# Patient Record
Sex: Female | Born: 1970
Health system: Southern US, Community
[De-identification: ages and names within clinical notes are randomized; demographics above are authoritative.]

## PROBLEM LIST (undated history)

## (undated) DIAGNOSIS — IMO0001 Reserved for inherently not codable concepts without codable children: Secondary | ICD-10-CM

## (undated) DIAGNOSIS — T8859XA Other complications of anesthesia, initial encounter: Secondary | ICD-10-CM

## (undated) DIAGNOSIS — Z5189 Encounter for other specified aftercare: Secondary | ICD-10-CM

## (undated) DIAGNOSIS — J189 Pneumonia, unspecified organism: Secondary | ICD-10-CM

## (undated) DIAGNOSIS — E785 Hyperlipidemia, unspecified: Secondary | ICD-10-CM

## (undated) DIAGNOSIS — T4145XA Adverse effect of unspecified anesthetic, initial encounter: Secondary | ICD-10-CM

## (undated) DIAGNOSIS — R112 Nausea with vomiting, unspecified: Secondary | ICD-10-CM

## (undated) DIAGNOSIS — Z9889 Other specified postprocedural states: Secondary | ICD-10-CM

## (undated) DIAGNOSIS — G4730 Sleep apnea, unspecified: Secondary | ICD-10-CM

## (undated) DIAGNOSIS — Z8489 Family history of other specified conditions: Secondary | ICD-10-CM

## (undated) DIAGNOSIS — A491 Streptococcal infection, unspecified site: Secondary | ICD-10-CM

## (undated) DIAGNOSIS — K5792 Diverticulitis of intestine, part unspecified, without perforation or abscess without bleeding: Secondary | ICD-10-CM

## (undated) DIAGNOSIS — G43909 Migraine, unspecified, not intractable, without status migrainosus: Secondary | ICD-10-CM

## (undated) DIAGNOSIS — I10 Essential (primary) hypertension: Secondary | ICD-10-CM

## (undated) HISTORY — PX: COLONOSCOPY: SHX174

## (undated) HISTORY — PX: OTHER SURGICAL HISTORY: SHX169

## (undated) HISTORY — PX: TUBAL LIGATION: SHX77

---
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
## 1999-09-23 ENCOUNTER — Other Ambulatory Visit: Admission: RE | Admit: 1999-09-23 | Discharge: 1999-09-23 | Payer: Self-pay | Admitting: *Deleted

## 2000-03-15 ENCOUNTER — Inpatient Hospital Stay (HOSPITAL_COMMUNITY): Admission: AD | Admit: 2000-03-15 | Discharge: 2000-03-15 | Payer: Self-pay | Admitting: Obstetrics and Gynecology

## 2000-04-09 ENCOUNTER — Inpatient Hospital Stay (HOSPITAL_COMMUNITY): Admission: AD | Admit: 2000-04-09 | Discharge: 2000-04-12 | Payer: Self-pay | Admitting: Obstetrics and Gynecology

## 2000-04-09 ENCOUNTER — Encounter (INDEPENDENT_AMBULATORY_CARE_PROVIDER_SITE_OTHER): Payer: Self-pay

## 2000-08-15 ENCOUNTER — Inpatient Hospital Stay (HOSPITAL_COMMUNITY): Admission: AD | Admit: 2000-08-15 | Discharge: 2000-08-15 | Payer: Self-pay | Admitting: Obstetrics and Gynecology

## 2000-08-21 ENCOUNTER — Observation Stay (HOSPITAL_COMMUNITY): Admission: EM | Admit: 2000-08-21 | Discharge: 2000-08-22 | Payer: Self-pay | Admitting: Emergency Medicine

## 2000-12-30 ENCOUNTER — Emergency Department (HOSPITAL_COMMUNITY): Admission: EM | Admit: 2000-12-30 | Discharge: 2000-12-31 | Payer: Self-pay | Admitting: *Deleted

## 2001-04-14 ENCOUNTER — Encounter: Admission: RE | Admit: 2001-04-14 | Discharge: 2001-04-14 | Payer: Self-pay | Admitting: Gastroenterology

## 2001-04-14 ENCOUNTER — Encounter: Payer: Self-pay | Admitting: Gastroenterology

## 2001-08-24 ENCOUNTER — Encounter: Payer: Self-pay | Admitting: Emergency Medicine

## 2001-08-24 ENCOUNTER — Emergency Department (HOSPITAL_COMMUNITY): Admission: EM | Admit: 2001-08-24 | Discharge: 2001-08-24 | Payer: Self-pay | Admitting: Emergency Medicine

## 2002-04-01 ENCOUNTER — Encounter: Payer: Self-pay | Admitting: Emergency Medicine

## 2002-04-01 ENCOUNTER — Emergency Department (HOSPITAL_COMMUNITY): Admission: EM | Admit: 2002-04-01 | Discharge: 2002-04-01 | Payer: Self-pay | Admitting: Emergency Medicine

## 2002-04-03 ENCOUNTER — Ambulatory Visit (HOSPITAL_COMMUNITY): Admission: RE | Admit: 2002-04-03 | Discharge: 2002-04-03 | Payer: Self-pay | Admitting: Family Medicine

## 2002-04-03 ENCOUNTER — Encounter: Payer: Self-pay | Admitting: Family Medicine

## 2003-03-12 ENCOUNTER — Encounter: Payer: Self-pay | Admitting: Family Medicine

## 2003-03-12 ENCOUNTER — Ambulatory Visit (HOSPITAL_COMMUNITY): Admission: RE | Admit: 2003-03-12 | Discharge: 2003-03-12 | Payer: Self-pay | Admitting: Family Medicine

## 2004-03-09 ENCOUNTER — Emergency Department (HOSPITAL_COMMUNITY): Admission: EM | Admit: 2004-03-09 | Discharge: 2004-03-09 | Payer: Self-pay | Admitting: Emergency Medicine

## 2004-03-11 ENCOUNTER — Emergency Department (HOSPITAL_COMMUNITY): Admission: EM | Admit: 2004-03-11 | Discharge: 2004-03-11 | Payer: Self-pay | Admitting: Emergency Medicine

## 2004-10-19 ENCOUNTER — Emergency Department (HOSPITAL_COMMUNITY): Admission: EM | Admit: 2004-10-19 | Discharge: 2004-10-19 | Payer: Self-pay | Admitting: Emergency Medicine

## 2005-04-13 ENCOUNTER — Emergency Department (HOSPITAL_COMMUNITY): Admission: EM | Admit: 2005-04-13 | Discharge: 2005-04-13 | Payer: Self-pay | Admitting: Emergency Medicine

## 2005-06-15 ENCOUNTER — Observation Stay (HOSPITAL_COMMUNITY): Admission: EM | Admit: 2005-06-15 | Discharge: 2005-06-17 | Payer: Self-pay | Admitting: Emergency Medicine

## 2007-01-03 ENCOUNTER — Emergency Department (HOSPITAL_COMMUNITY): Admission: EM | Admit: 2007-01-03 | Discharge: 2007-01-04 | Payer: Self-pay | Admitting: Emergency Medicine

## 2007-01-13 ENCOUNTER — Ambulatory Visit: Payer: Self-pay | Admitting: Cardiology

## 2007-02-06 ENCOUNTER — Ambulatory Visit: Payer: Self-pay | Admitting: Cardiology

## 2007-02-06 LAB — CONVERTED CEMR LAB
BUN: 12 mg/dL (ref 6–23)
Bacteria, UA: NEGATIVE
Bilirubin Urine: NEGATIVE
CO2: 20 meq/L (ref 19–32)
Calcium: 8.7 mg/dL (ref 8.4–10.5)
Chloride: 107 meq/L (ref 96–112)
Creatinine, Ser: 0.9 mg/dL (ref 0.4–1.2)
Crystals: NEGATIVE
GFR calc Af Amer: 92 mL/min
GFR calc non Af Amer: 76 mL/min
Glucose, Bld: 81 mg/dL (ref 70–99)
Hemoglobin, Urine: NEGATIVE
Ketones, ur: NEGATIVE mg/dL
Leukocytes, UA: NEGATIVE
Mucus, UA: NEGATIVE
Nitrite: NEGATIVE
Potassium: 3.3 meq/L — ABNORMAL LOW (ref 3.5–5.1)
Sodium: 139 meq/L (ref 135–145)
Specific Gravity, Urine: >=1.03 (ref 1.000–1.03)
Total Protein, Urine: 100 mg/dL — AB
Urine Glucose: NEGATIVE mg/dL
Urobilinogen, UA: 0.2 (ref 0.0–1.0)
pH: 5.5 (ref 5.0–8.0)

## 2007-07-12 ENCOUNTER — Observation Stay (HOSPITAL_COMMUNITY): Admission: EM | Admit: 2007-07-12 | Discharge: 2007-07-14 | Payer: Self-pay | Admitting: Emergency Medicine

## 2007-07-13 ENCOUNTER — Encounter: Payer: Self-pay | Admitting: Internal Medicine

## 2007-07-13 ENCOUNTER — Ambulatory Visit: Payer: Self-pay | Admitting: Cardiology

## 2007-07-13 ENCOUNTER — Ambulatory Visit: Payer: Self-pay | Admitting: Internal Medicine

## 2008-03-28 ENCOUNTER — Other Ambulatory Visit: Admission: RE | Admit: 2008-03-28 | Discharge: 2008-03-28 | Payer: Self-pay | Admitting: Gynecology

## 2008-03-28 ENCOUNTER — Emergency Department (HOSPITAL_COMMUNITY): Admission: EM | Admit: 2008-03-28 | Discharge: 2008-03-28 | Payer: Self-pay | Admitting: Emergency Medicine

## 2008-05-16 ENCOUNTER — Emergency Department (HOSPITAL_COMMUNITY): Admission: EM | Admit: 2008-05-16 | Discharge: 2008-05-17 | Payer: Self-pay | Admitting: Emergency Medicine

## 2008-08-06 ENCOUNTER — Emergency Department (HOSPITAL_COMMUNITY): Admission: EM | Admit: 2008-08-06 | Discharge: 2008-08-06 | Payer: Self-pay | Admitting: Emergency Medicine

## 2008-08-07 ENCOUNTER — Emergency Department (HOSPITAL_COMMUNITY): Admission: EM | Admit: 2008-08-07 | Discharge: 2008-08-08 | Payer: Self-pay | Admitting: Emergency Medicine

## 2008-08-26 IMAGING — CT CT NECK W/ CM
3 series · 16 of 33 positions shown, 19 images · IV contrast (APPLIED)
Comparison: None

CLINICAL DATA: Sore throat with right-sided neck swelling.  Right
jaw aligned swelling extending to the from the neck.  Difficulty
swallowing.

CT NECK WITH CONTRAST
TECHNIQUE: Multidetector CT imaging of the neck was performed with
intravenous contrast.
Contrast: 80 ml [id number]

[Series 3: st neck 2.0 b31s · axial · 0.39mm/px · z∈[+1004,+1180]mm · 8 of 106 slices shown, 10 images]
[im 9/106  soft-tissue]
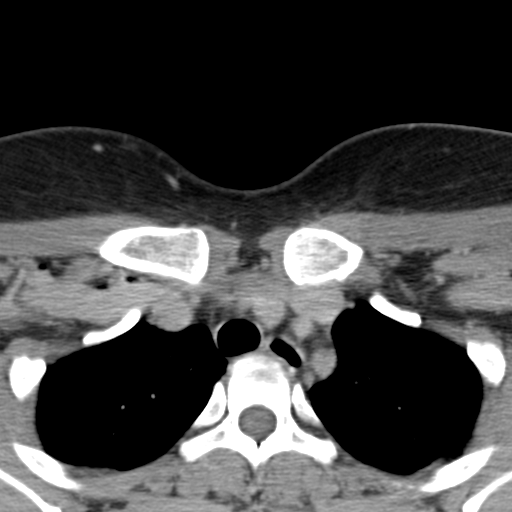
[im 9/106  bone]
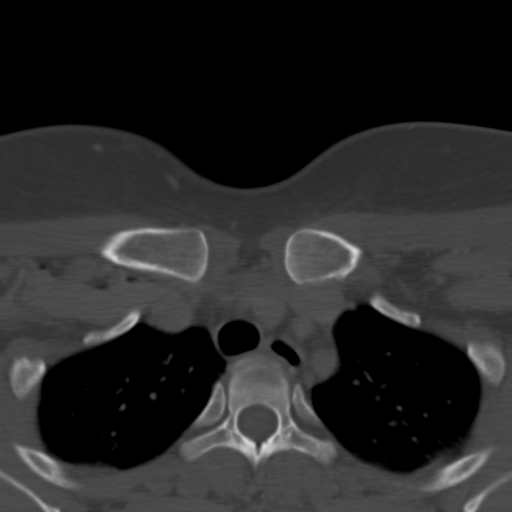
[im 25/106  bone]
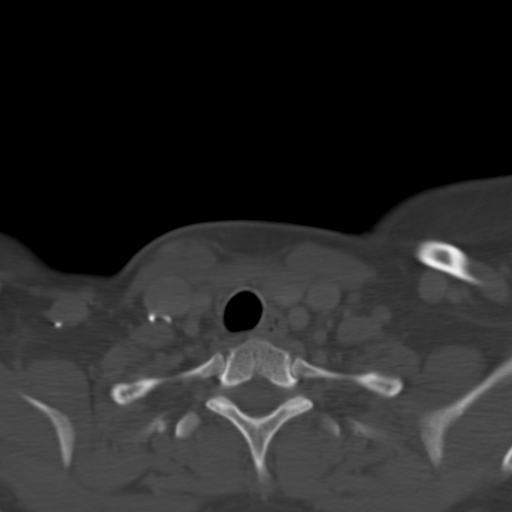
[im 33/106  bone]
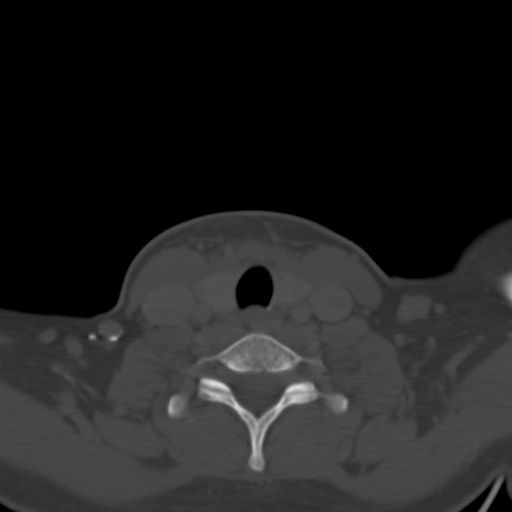
[im 49/106  bone]
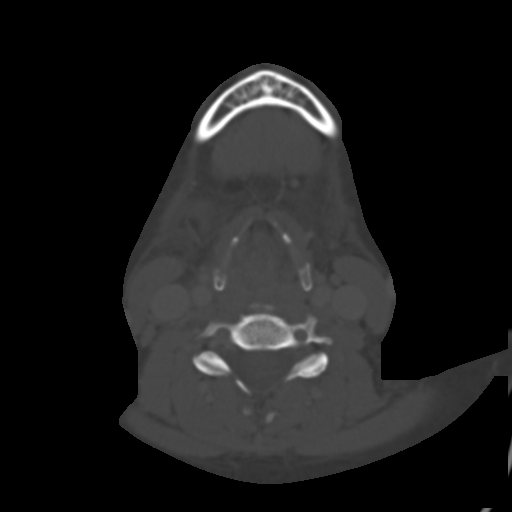
[im 57/106  soft-tissue]
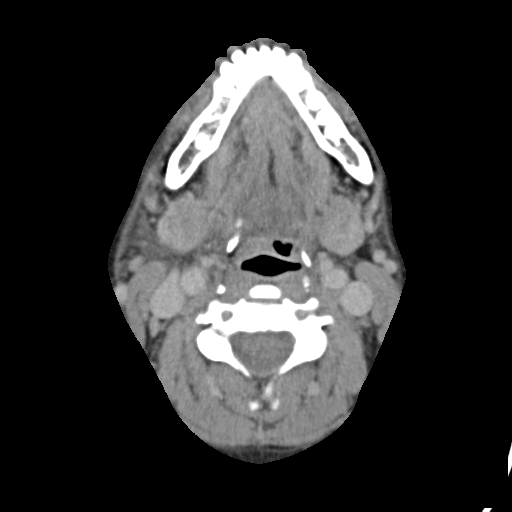
[im 57/106  bone]
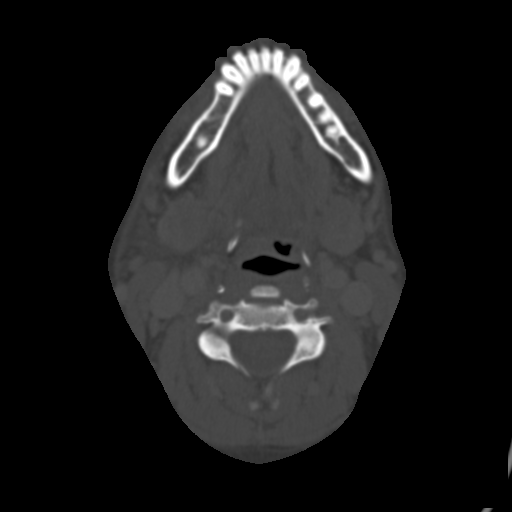
[im 73/106  bone]
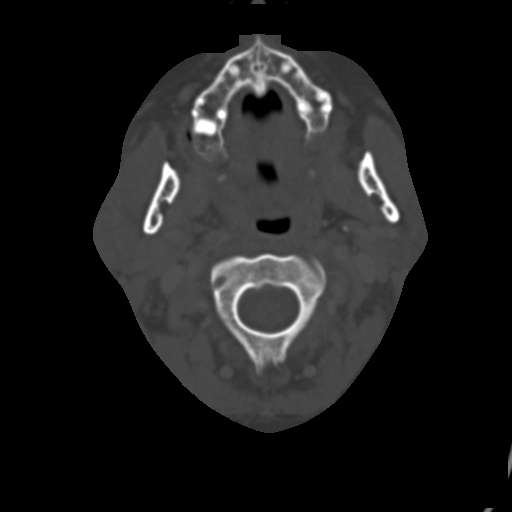
[im 81/106  bone]
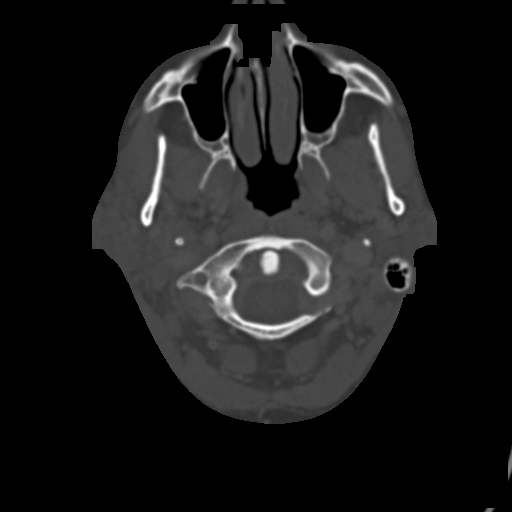
[im 97/106  bone]
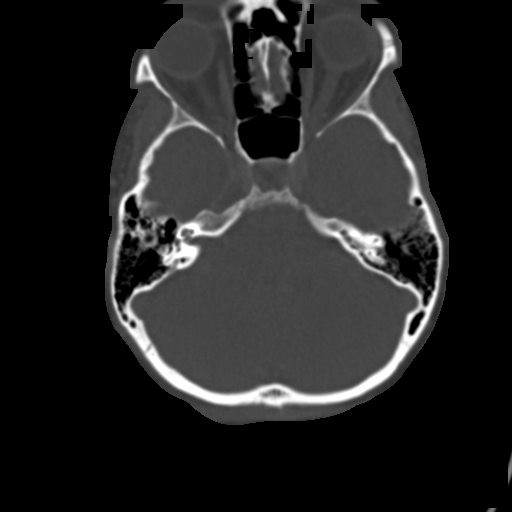

[Series 602: coronal st · coronal · 0.41mm/px · 3 of 58 slices shown]
[im 12/58  bone]
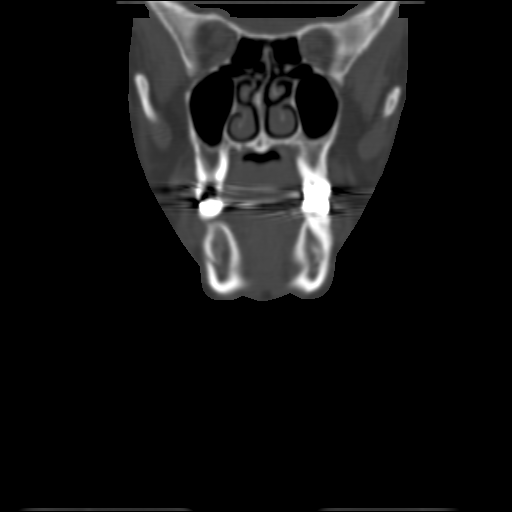
[im 23/58  bone]
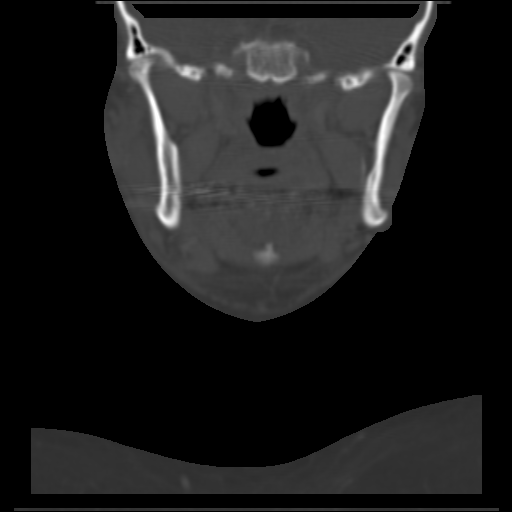
[im 35/58  bone]
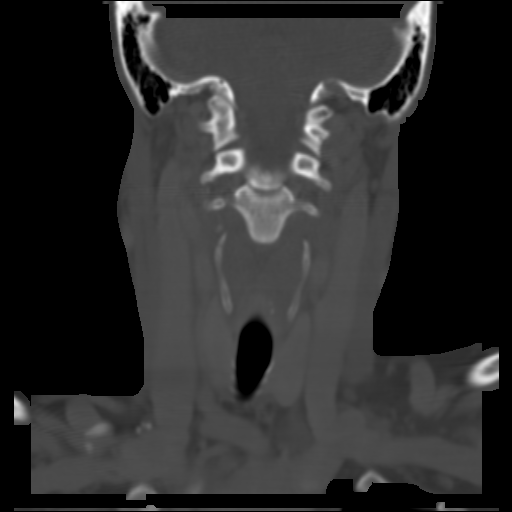

[Series 603: sagittal st · sagittal · 0.41mm/px · 5 of 52 slices shown, 6 images]
[im 18/52  bone]
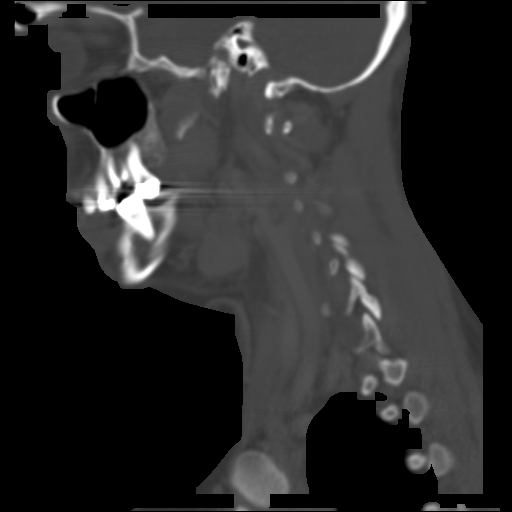
[im 22/52  bone]
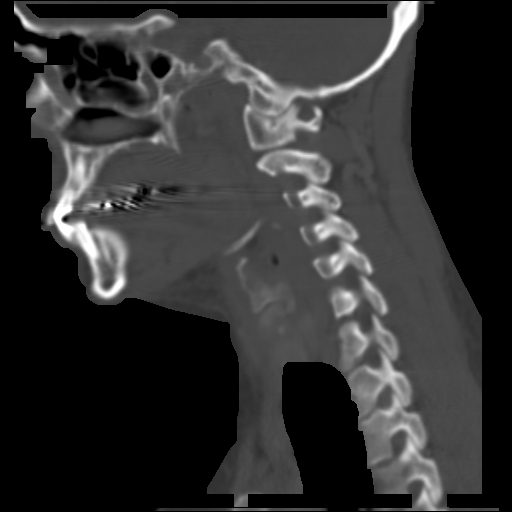
[im 26/52  soft-tissue]
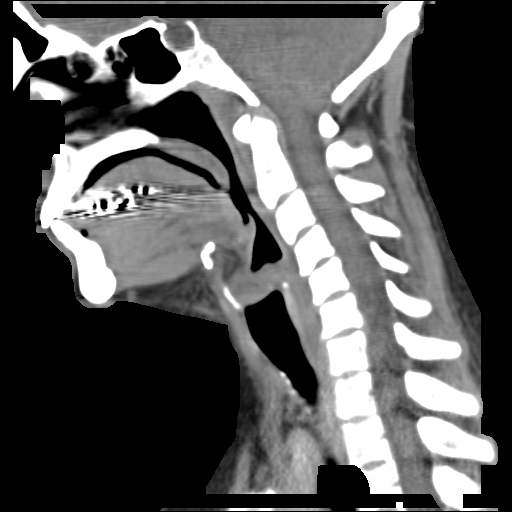
[im 26/52  bone]
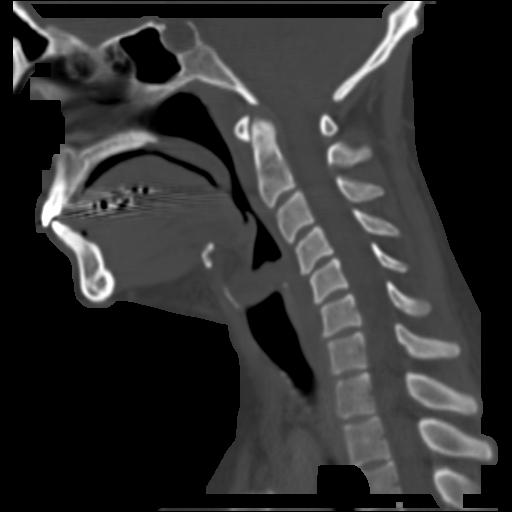
[im 30/52  bone]
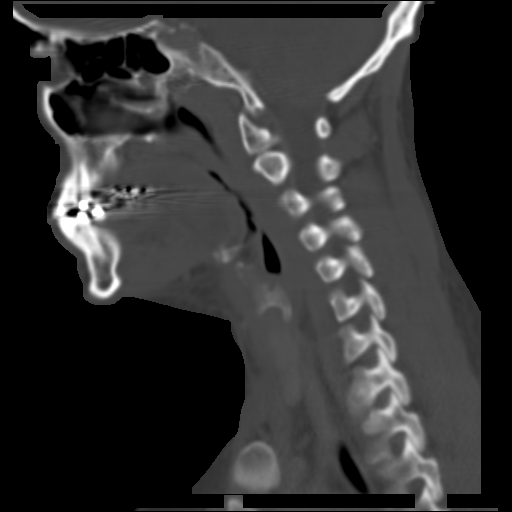
[im 35/52  bone]
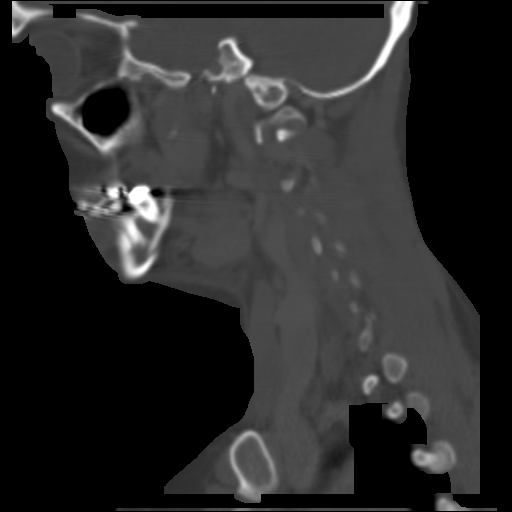

[16 of 33 positions shown; findings below may reference images not displayed]

FINDINGS: There is swelling and abnormal enhancement of the right
parotid gland with some slight soft tissue edema extending
inferiorly along the fascial planes from the parotid gland adjacent
to the right submandibular gland.  This extends across the midline
adjacent to the left submandibular gland.

There is no discrete abscess.  There is no evidence of discrete
tonsilitis or abnormal prevertebral soft tissue swelling.  No
significant adenopathy in the neck.
IMPRESSION: Right parotitis.

## 2008-10-01 ENCOUNTER — Ambulatory Visit: Payer: Self-pay | Admitting: Cardiology

## 2009-07-27 ENCOUNTER — Inpatient Hospital Stay (HOSPITAL_COMMUNITY): Admission: AD | Admit: 2009-07-27 | Discharge: 2009-07-27 | Payer: Self-pay | Admitting: Obstetrics and Gynecology

## 2009-07-27 ENCOUNTER — Ambulatory Visit: Payer: Self-pay | Admitting: Family

## 2009-09-23 ENCOUNTER — Inpatient Hospital Stay (HOSPITAL_COMMUNITY): Admission: AD | Admit: 2009-09-23 | Discharge: 2009-09-23 | Payer: Self-pay | Admitting: Obstetrics and Gynecology

## 2009-09-25 ENCOUNTER — Inpatient Hospital Stay (HOSPITAL_COMMUNITY): Admission: AD | Admit: 2009-09-25 | Discharge: 2009-09-25 | Payer: Self-pay | Admitting: Obstetrics and Gynecology

## 2009-10-08 ENCOUNTER — Inpatient Hospital Stay (HOSPITAL_COMMUNITY): Admission: RE | Admit: 2009-10-08 | Discharge: 2009-10-10 | Payer: Self-pay | Admitting: Obstetrics and Gynecology

## 2010-11-22 NOTE — L&D Delivery Note (Signed)
Delivery Note  SVD viable female Apgars 8,9 over 1st degree midline laceration.  Placenta delivered spontaneously intact with 3VC. Repair with 2-0 Chromic with good support and hemostasis noted and R/V exam confirms.  PH art was 7.29.  [place] cord blood was not available.Wt 7+4#.  Mother and baby were doing well.  EBL 400 due to some atony which resolved with massage, Pitocin IV and cytotec 800 mcg rectally  Candice Camp, MD

## 2010-12-31 LAB — ANTIBODY SCREEN: Antibody Screen: NEGATIVE

## 2010-12-31 LAB — HEPATITIS B SURFACE ANTIGEN: Hepatitis B Surface Ag: NEGATIVE

## 2010-12-31 LAB — HIV ANTIBODY (ROUTINE TESTING W REFLEX): HIV: NONREACTIVE

## 2010-12-31 LAB — ABO/RH

## 2011-01-22 ENCOUNTER — Inpatient Hospital Stay (HOSPITAL_COMMUNITY)
Admission: AD | Admit: 2011-01-22 | Discharge: 2011-01-22 | Disposition: A | Discharge status: Home / Self Care | Payer: BC Managed Care – PPO | Source: Ambulatory Visit | Attending: Obstetrics and Gynecology | Admitting: Obstetrics and Gynecology

## 2011-01-22 DIAGNOSIS — O210 Mild hyperemesis gravidarum: Secondary | ICD-10-CM | POA: Insufficient documentation

## 2011-01-22 LAB — URINALYSIS, ROUTINE W REFLEX MICROSCOPIC
Glucose, UA: NEGATIVE mg/dL
Ketones, ur: >80 mg/dL — AB
Nitrite: NEGATIVE
Protein, ur: NEGATIVE mg/dL
Specific Gravity, Urine: >1.03 — ABNORMAL HIGH (ref 1.005–1.030)
Urobilinogen, UA: 0.2 mg/dL (ref 0.0–1.0)
pH: 6 (ref 5.0–8.0)

## 2011-01-22 LAB — COMPREHENSIVE METABOLIC PANEL
ALT: 14 U/L (ref 0–35)
Albumin: 3.1 g/dL — ABNORMAL LOW (ref 3.5–5.2)
BUN: 11 mg/dL (ref 6–23)
CO2: 22 mEq/L (ref 19–32)
Calcium: 8.7 mg/dL (ref 8.4–10.5)
Chloride: 103 mEq/L (ref 96–112)
Creatinine, Ser: 0.65 mg/dL (ref 0.4–1.2)
GFR calc non Af Amer: >60 mL/min (ref >60)
Glucose, Bld: 73 mg/dL (ref 70–99)
Potassium: 3.5 mEq/L (ref 3.5–5.1)
Total Protein: 6.2 g/dL (ref 6.0–8.3)

## 2011-01-22 LAB — URINE MICROSCOPIC-ADD ON

## 2011-02-24 LAB — CBC
HCT: 26.5 % — ABNORMAL LOW (ref 36.0–46.0)
HCT: 30.6 % — ABNORMAL LOW (ref 36.0–46.0)
HCT: 32.2 % — ABNORMAL LOW (ref 36.0–46.0)
HCT: 33.5 % — ABNORMAL LOW (ref 36.0–46.0)
Hemoglobin: 10.1 g/dL — ABNORMAL LOW (ref 12.0–15.0)
Hemoglobin: 8.8 g/dL — ABNORMAL LOW (ref 12.0–15.0)
Hemoglobin: 11 g/dL — ABNORMAL LOW (ref 12.0–15.0)
MCHC: 33.2 g/dL (ref 30.0–36.0)
MCHC: 33.3 g/dL (ref 30.0–36.0)
MCHC: 32.8 g/dL (ref 30.0–36.0)
MCHC: 33.2 g/dL (ref 30.0–36.0)
MCV: 84.4 fL (ref 78.0–100.0)
MCV: 84.6 fL (ref 78.0–100.0)
MCV: 85.7 fL (ref 78.0–100.0)
MCV: 86.4 fL (ref 78.0–100.0)
Platelets: 233 10*3/uL (ref 150–400)
Platelets: 265 10*3/uL (ref 150–400)
Platelets: 259 10*3/uL (ref 150–400)
Platelets: 270 10*3/uL (ref 150–400)
RBC: 3.13 MIL/uL — ABNORMAL LOW (ref 3.87–5.11)
RBC: 3.62 MIL/uL — ABNORMAL LOW (ref 3.87–5.11)
RBC: 3.88 MIL/uL (ref 3.87–5.11)
RDW: 14.8 % (ref 11.5–15.5)
RDW: 15 % (ref 11.5–15.5)
RDW: 14.2 % (ref 11.5–15.5)
RDW: 14.4 % (ref 11.5–15.5)
WBC: 10 10*3/uL (ref 4.0–10.5)
WBC: 14.6 10*3/uL — ABNORMAL HIGH (ref 4.0–10.5)
WBC: 11.4 10*3/uL — ABNORMAL HIGH (ref 4.0–10.5)

## 2011-02-24 LAB — COMPREHENSIVE METABOLIC PANEL
ALT: 14 U/L (ref 0–35)
ALT: 11 U/L (ref 0–35)
AST: 22 U/L (ref 0–37)
AST: 19 U/L (ref 0–37)
Albumin: 2.5 g/dL — ABNORMAL LOW (ref 3.5–5.2)
Albumin: 2.6 g/dL — ABNORMAL LOW (ref 3.5–5.2)
Albumin: 2.7 g/dL — ABNORMAL LOW (ref 3.5–5.2)
Alkaline Phosphatase: 145 U/L — ABNORMAL HIGH (ref 39–117)
Alkaline Phosphatase: 129 U/L — ABNORMAL HIGH (ref 39–117)
BUN: 9 mg/dL (ref 6–23)
BUN: 7 mg/dL (ref 6–23)
BUN: 9 mg/dL (ref 6–23)
CO2: 26 mEq/L (ref 19–32)
CO2: 22 mEq/L (ref 19–32)
Calcium: 9.1 mg/dL (ref 8.4–10.5)
Calcium: 8.5 mg/dL (ref 8.4–10.5)
Calcium: 8.6 mg/dL (ref 8.4–10.5)
Chloride: 103 mEq/L (ref 96–112)
Chloride: 108 mEq/L (ref 96–112)
Creatinine, Ser: 0.61 mg/dL (ref 0.4–1.2)
Creatinine, Ser: 0.55 mg/dL (ref 0.4–1.2)
Creatinine, Ser: 0.61 mg/dL (ref 0.4–1.2)
GFR calc Af Amer: >60 mL/min (ref >60)
GFR calc Af Amer: >60 mL/min (ref >60)
GFR calc non Af Amer: >60 mL/min (ref >60)
GFR calc non Af Amer: >60 mL/min (ref >60)
Glucose, Bld: 91 mg/dL (ref 70–99)
Glucose, Bld: 75 mg/dL (ref 70–99)
Potassium: 3.6 mEq/L (ref 3.5–5.1)
Potassium: 3.8 mEq/L (ref 3.5–5.1)
Potassium: 4 mEq/L (ref 3.5–5.1)
Sodium: 135 mEq/L (ref 135–145)
Sodium: 136 mEq/L (ref 135–145)
Total Bilirubin: 0.4 mg/dL (ref 0.3–1.2)
Total Bilirubin: 0.3 mg/dL (ref 0.3–1.2)
Total Protein: 5.5 g/dL — ABNORMAL LOW (ref 6.0–8.3)
Total Protein: 5.8 g/dL — ABNORMAL LOW (ref 6.0–8.3)
Total Protein: 5.9 g/dL — ABNORMAL LOW (ref 6.0–8.3)

## 2011-02-24 LAB — URIC ACID
Uric Acid, Serum: 5.3 mg/dL (ref 2.4–7.0)
Uric Acid, Serum: 4.9 mg/dL (ref 2.4–7.0)
Uric Acid, Serum: 5 mg/dL (ref 2.4–7.0)

## 2011-02-24 LAB — RPR: RPR Ser Ql: NONREACTIVE

## 2011-02-24 LAB — LACTATE DEHYDROGENASE
LDH: 166 U/L (ref 94–250)
LDH: 161 U/L (ref 94–250)

## 2011-02-26 LAB — CBC
HCT: 32.8 % — ABNORMAL LOW (ref 36.0–46.0)
Hemoglobin: 11.2 g/dL — ABNORMAL LOW (ref 12.0–15.0)
RBC: 3.63 MIL/uL — ABNORMAL LOW (ref 3.87–5.11)
WBC: 10.1 10*3/uL (ref 4.0–10.5)

## 2011-04-06 NOTE — H&P (Signed)
NAME:  DESTRY, BEZDEK                ACCOUNT NO.:  [id number]   MEDICAL RECORD NO.:  [id number]          PATIENT TYPE:  EMS   LOCATION:  [facility]                         FACILITY:  [facility]   PHYSICIAN:  Dorian Pod, ACNP  DATE OF BIRTH:  Jan 09, 1971   DATE OF ADMISSION:  07/12/2007  DATE OF DISCHARGE:                              HISTORY & PHYSICAL   PRIMARY CARDIOLOGIST:  Rollene Rotunda, MD, [facility].   PRIMARY CARE:  The patient is followed by the [facility] at [facility]   HISTORY OF PRESENT ILLNESS:  Tamara Frazier is a 40 year old Caucasian female  with a history of recurrent syncope with extensive neuro and cardiac  workup as a teenager.  Questionable diagnosis of autonomic dysfunction;  also with recurrent chest pain with negative Myoview in 01/2007 after  being evaluated by Dr. Antoine Poche.  The patient returns today presenting  with recurrent atypical chest pain; also with questionable syncopal  episodes with well-preserved blood pressure and heart rate.   Ms. Trine describes the chest discomfort as a heaviness under her left  breast.  She states she has had this intermittently throughout the week.  Yesterday, while at work, she experienced fleeting discomfort.  Then  last night, while watching TV, she states it suddenly hit her, intense  pain.  She states she doubled over and had some left arm tingling.  She  describes it as a dull ache associated with shortness of breath and  worse with taking a deep breath.  She states it lasted around 30 minutes  last night.  She did take her blood pressure medicine and a PPI without  any relief.  This morning she states she got up to go the bathroom; the  chest discomfort returned.  She stood up to walk to the bathroom.  Everything went black, and when she came to, she was lying on her right  side.  Her boyfriend states she was out for maybe 2-3 minutes.  He  apparently had walked out of the bedroom into the kitchen and returned  within 5 minutes  and found her on the floor.   Ms. Callanan states she that this is the third syncopal episode she has had  in the last 2 years.  The syncopal episode is usually triggered by  increased emotional or physical pain.   PAST MEDICAL HISTORY:  Includes:  1. Hypertension times 1-1/2 years.  2. Status post GI bleed in 2001 in the setting of heavy NSAID use.  3. Status post colonoscopy at that time with a diagnosis of      diverticulitis.  4. Status post EGD that showed a very small hiatal hernia; otherwise      negative.  5. History of ovarian cyst.  6. History of syncope that initially started while she was in high      school.  The patient apparently had an extensive workup cardiac-      wise and neurologically when she was 40 years old.  7. History of migraines.  8. History of tubal ligation.  9. History of an exercise stress test in  01/2007, showing no ischemia.   ALLERGIES:  Include Hydrocodone.   MEDICATIONS:  1. HCTZ 25 mg daily.  2. KCl 10 mEq daily.   SOCIAL HISTORY:  The patient lives in [place] with her boyfriend.  She is currently separated from her husband.  She works for [employer].  She has 3 children.  Denies any tobacco, EtOH, drug or herbal medication  use.  Regular diet.  She is physically active.  She walks or jogs three  times a week.   FAMILY HISTORY:  Mother is alive with hypertension.  Father is alive  with known history of coronary artery disease/ischemic cardiomyopathy;  had his first MI in his 10s.  Sibling, 1 sister with hypertension.  No  history of syncope.   REVIEW OF SYSTEMS:  Positive for headaches, shortness of breath  associated with chest discomfort, presyncopal episodes and syncopal  episodes.  GI is positive for melena.  Patient states she saw a small  amount of blood in her stool yesterday.  She reports this happens  occasionally and is usually associated with her diverticulitis.   PHYSICAL EXAM:  VITAL SIGNS:  Temperature 97.4, pulse 79,  respirations  16, blood pressure 180/97 and 162/110, sat 100% on 2 liters.  GENERAL:  She is in no acute distress.  HEENT:  Normocephalic, atraumatic.  Pupils equal, round and reactive to  light.  Sclerae is clear.  NECK:  Supple without lymphadenopathy, no bruits, no JVD.  CARDIOVASCULAR:  Reveals S1 and S2, regular rate and rhythm.  LUNGS:  Clear to auscultation.  SKIN:  Warm and dry.  ABDOMEN:  Soft, nontender, positive bowel sounds.  LOWER EXTREMITIES:  Without clubbing, cyanosis or edema.  NEUROLOGIC:  She is alert and oriented x3.  Cranial nerves II-XII  grossly intact.   DATA:  Chest x-ray showing no acute findings or no active disease.  EKG:  Sinus rhythm at a rate of 69.  Baseline labs:  H&H 11.2 and 33.  Sodium  138, potassium 3.8, chloride 107, BUN 6, creatinine 0.8, glucose 87.  The patient has had 2 point-of-cares negative, D-dimer less than 0.22.   IMPRESSION:  Dr. Massie Kluver came in to examine and assess patient  with complaints of chest pain that appears to be pleuritic in nature,  questionable syncopal episodes, in the setting of poorly controlled  hypertension.  We suspect a component of emotional stress, given she is  in the process of marital separation.  Dr. Derenda Fennel did talk about the  possibility of cardiac catheterization for definitive diagnosis of chest  pain.  The patient states she is not sure; she would like to think about  it.   PLAN:  The plan at this time is to admit the patient to a 23-hour  observation telemetry bed, hydrate, use Toradol for atypical chest  discomfort, cycle cardiac enzymes, check a 2-D echocardiogram, check a  urine HCG, probably discharge in a.m.  If recurrent syncopal episode,  proceed with an EP evaluation.  The patient also needs improved  management of her blood pressure.      Dorian Pod, ACNP     MB/MEDQ  D:  07/12/2007  T:  07/13/2007  Job:  [id number]

## 2011-04-06 NOTE — Assessment & Plan Note (Signed)
[facility]                            CARDIOLOGY OFFICE NOTE   Tamara Frazier, PLOUFF                         MRN:          [id number]  DATE:10/01/2008                            DOB:          May 04, 1971    REFERRING PHYSICIAN:  Dr. Dani Gobble   REASON FOR PRESENTATION:  Evaluate the patient with syncope.   HISTORY OF PRESENT ILLNESS:  The patient returns to our clinic after  last having been seen here in February 2008.  She was in the hospital in  May for some chest discomfort.  We were not planning on seeing her back.  She has a long history of syncope probably with some autonomic  dysfunction.  This has been worked up in the past.  There has been  usually stimuli leading to this that would suggest a vagal disorder.  She was last seen for evaluation of chest pain.  She had a negative  exercise treadmill test in March of 2008.  She was hospitalized in  October for syncopal episode.  The etiology was not clear.  She had an  echocardiogram at that time, which was unremarkable except for some mild  dyssynergy of the interventricular septum.  She was discharged with not  any plans for further workup.   She states she did well since that time.  She had had no syncope until  about 2 months ago.  She states she started getting some bronchitic-type  symptoms (for which she is still being treated) and had syncope.  This  was about 3 times within a 48-hour period.  This was not unlike previous  episodes, when she had some prodrome and could tried to lay down.  However, with these 3 episodes, she apparently had some tonic-clonic  activity witnessed by her husband.  She did not realize this.  There was  a loss of bowel or bladder.  Since that time, she has had no further  syncope or presyncope.  She has otherwise felt quite well.  She does her  chores of daily living including house cleaning.  She has not been  exercising as much as she used to.   However, with her activity, she is  not having any chest pressure, neck, or arm discomfort.  She is not  having any palpitations.  She has had no shortness of breath.  She  denies any PND or orthopnea.   Of note, she did see a neurologist and was told that she probably had a  seizure disorder.  In fact, she had gone to the ER the day, she passed  out.  She had a CT and a neuro consult.  She reports this has been  normal.  She had an EEG in the Neurology Office and said this was  normal.  She was treated with Depakote, but felt wobble on this.  She  took herself off.  She is questioning a new diagnosis of seizure  disorder and actually she is going to see [facility].   PAST MEDICAL HISTORY:  Hypertension, remote GI bleeding in the setting  of nonsteroidal use, diverticulitis, hiatal hernia, ovarian cyst, and  migraines.   PAST SURGICAL HISTORY:  Tubal ligation.   ALLERGIES/INTOLERANCES:  HYDROCODONE.   MEDICATIONS:  1. Hydrochlorothiazide 25 mg daily.  2. Potassium 20 mEq daily.  3. MiraLax.   SOCIAL HISTORY:  The patient is an admissions counselor.  She has 3  children.  She smoked briefly as a teenager.   FAMILY HISTORY:  Contributory for father having myocardial infarction in  his 30s.  He had an ischemic cardiomyopathy.   REVIEW OF SYSTEMS:  As stated in the HPI and positive for migraines,  ongoing bronchitis, occasional mild orthostatic symptoms, and  constipation.  Negative for all other systems.   PHYSICAL EXAMINATION:  GENERAL:  The patient is pleasant and in no  distress.  VITAL SIGNS:  Blood pressure 149/97, although orthostatic blood pressure  dropped, heart rate 82 and regular, weight 184 pounds, and body mass  index 31.  HEENT:  Eyelids are unremarkable; pupils equal, round, and reactive to  light; fundi not visualized; oral mucosa unremarkable.  NECK:  No jugular venous distention at 45 degrees; carotid upstroke  brisk and symmetric; no bruits,  no thyromegaly.  LYMPHATICS:  No cervical, axillary, or inguinal adenopathy.  LUNGS:  Clear to auscultation bilaterally.  BACK:  No costovertebral angle tenderness.  CHEST:  Unremarkable.  HEART:  PMI not displaced or sustained; S1 and S2 within normal limits;  no S3, no S4; no clicks, no rubs, no murmurs.  ABDOMEN:  Obese; positive bowel sounds; normal in frequency and pitch;  no bruits, rebound, guarding, or midline pulsatile mass; no  hepatomegaly, no splenomegaly.  SKIN:  No rashes, no nodules.  EXTREMITIES:  Pulses 2+ throughout; no edema, cyanosis, or clubbing.  NEURO:  Oriented to person, place, and time; cranial nerves II-XII  grossly intact; motor grossly intact.   EKG sinus rhythm, rate 67, axis within normal limits, intervals within  normal limits, poor anterior R-wave progression, no acute ST-T wave  changes.   ASSESSMENT AND PLAN:  1. Syncope.  The patient has had recurrent syncope similar to her      previous episodes.  She has had none in the last couple of months.      She is being worked up for possible seizure disorder, but that is      just a diagnosis.  She will be getting a second opinion at [facility].  For now, I do not think there is any change to      her therapy or further testing that needs to be done as she has had      workups in the past.  However, if she has any recurrent episodes      with more frequency, I would consider reevaluating her for what is      presumed autonomic dysfunction.  She will not be driving, it can      only be released by apparently Neurology to do this, so this is not      an issue currently.  2. Hypertension.  Blood pressure is elevated.  Apparently, slightly      labile.  I have taken the liberty of starting Norvasc 2.5 mg daily.      She was on this drug before and tolerated it.  She was on clonidine      and stopped this.  I will see whether she was on other medications.  3. Questionable seizure disorder as  above.  4. Constipation per her primary physicians.  5. Followup.  I will see her back as needed based on future symptoms.      Rollene Rotunda, MD, [facility]  Electronically Signed    JH/MedQ  DD: 10/01/2008  DT: 10/02/2008  Job #: [id number]   cc:   Dr. Dani Gobble

## 2011-04-06 NOTE — Consult Note (Signed)
NAMEPAT, ELICKER                ACCOUNT NO.:  [id number]   MEDICAL RECORD NO.:  [id number]          PATIENT TYPE:  EMS   LOCATION:  MAJO                         FACILITY:  [facility]   PHYSICIAN:  Casimiro Needle L. Reynolds, M.D.DATE OF BIRTH:  Oct 08, 1971   DATE OF CONSULTATION:  08/06/2008  DATE OF DISCHARGE:  08/06/2008                                 CONSULTATION   REQUESTING PHYSICIAN:  [facility].   CHIEF COMPLAINT:  Presumed seizure.   HISTORY OF PRESENT ILLNESS:  This is the initial inpatient consultation  evaluation of this 40 year old woman with a past history which includes  vasovagal syncope and questionable autonomic dysfunction, as well as  hypertension.  The patient states that she had a questionable seizure  event back in her teens, at which time, she had an sleep-deprived EEG,  which was described as borderline abnormal.  However, she has not been  diagnosed with epilepsy and has not been on anticonvulsants in the past.  She states that she was feeling well at home today until she experienced  a sudden onset of nausea while sitting and talking on the phone and  answering messages.  This nausea was intense enough that she actually  called her husband and asked him to come back by the house and check on  her.  She then tried to get up and go the bathroom, and following the  bathroom felt lightheaded and felt her vision grayed out.  She says  she tried to lie down, but does not recall anything else until she woke  up, and her husband standing over her.  Her husband states that when he  came in, he could hear her banging her head against the bathroom floor.  He came in and saw this, and says that it went on for about a minute.  After that, she stopped and actually came around relatively quickly to  awareness of what was going on around her.  After a few minutes, she was  able to get up and get back down to the bed.  She was lying in bed when  she again felt  the sensation of presyncope and graying of the vision,  and she had another spell witnessed entirely by her husband, which  lasted about a minute and consisted of extension and kicking of the  legs along with her head turned back, and her eyes rolled up.  She again  convulsed for about a minute, then stopped, and came around relatively  quickly.  At that point, he called 911, and she was brought to the  emergency department.  Here, she has been complaining of a headache,  mostly on the right side of the head where she hit her head.  She said  that she had a third spell in the ED, which was unwitnessed but during  which she felt the symptoms coming on just as they had on the first 2  times.  She says she has never had anything like this before.  She does  have passing out spells but states that they are brief and not  associated convulsions like this.  Neurologic consultation is requested  for further evaluation.   REVIEW OF SYSTEMS:  She states that she has not been feeling well over  the last several days.  She does report a little bit heart racing just  prior to passing out the first time.  She denies any abdominal pain, any  chest pain, any focal neurologic symptoms at this time.  Otherwise,  negative per the emergency room nursing records.   PAST MEDICAL HISTORY:  Remarkable for hypertension for which she is on  medication.  She also has a history of palpitations.  She is followed  chronically at [facility].  She reports history of  migraines, and she actually has as many as a couple of migraines a week.  She states that these are usually bit pretty well controlled with  Excedrin Migraine, although she sometimes will get a debilitating  headache, which is not aborted by Excedrin.  She had some spells of  passing out when she was teenager, and as noted above had a seizure  workup since that time including a questionably abnormal EEG.  She has  had several EKGs,  echoes, etc., none of which have turned out  significant abnormalities.   FAMILY HISTORY:  Specifically negative for seizure.   SOCIAL HISTORY:  She lives at home with her husband, cares for young  children.  She also works at [employer].  She occasionally consumes  alcohol, denies use of tobacco or illicit drugs.   MEDICATION:  Hydrochlorothiazide.   ALLERGIES:  HYDROCODONE.   PHYSICAL EXAMINATION:  VITAL SIGNS:  Temperature 97.7, blood pressure  142/92, pulse 77, respirations 16, O2 sat 100% on room air.  GENERAL:  This is a healthy-appearing woman, supine in the hospital bed,  no evident distress.  HEENT:  Head, her cranium sounds like an atraumatic.  Oropharynx is  benign, which has a little bit of dry blood at the right corner of the  mouth.  NECK:  Supple without carotid or supraclavicular bruits.  HEART:  Regular rate and rhythm without murmur.  NEUROLOGIC:  Mental status, she is awake and alert.  She is fully  oriented to time, place, and person.  Recent and remote memory are  intact.  Attention span, concentration, and fund of knowledge are all  appropriate.  Speech is fluent and not dysarthric.  Mood is euthymic and  affect appropriate.  Cranial nerves:  Pupils are equal and briskly reactive.  Extraocular  movements are full without nystagmus.  Visual fields full to  confrontation.  Hearing is intact.  Conversational speech.  Face,  tongue, and palate move normally and symmetrically.  Motor:  Normal bulk and tone.  Normal strength in all tested extremity  muscle.  Sensation intact to light touch and double stimulation in all  extremities.  Coordination:  Finger-to-nose and heel-to-shin are performed accurately.  Gait is deferred.  Reflexes are 2+ and symmetric.  Toes are downgoing  bilaterally.   LABORATORY REVIEW:  CBC on admission is unremarkable.  Urinalysis is  unremarkable.  BMET unremarkable.  Urine drug screen is negative.  CT of  the head is personally  reviewed, and the study is unremarkable.   IMPRESSION:  1. Presumed seizure:  The history that is provided by the patient and      her husband is fairly convincing for a convulsive seizure and is      not very consistent with syncopal convulsion as one of the  witnessed episodes occurred when she was in a supine position.      Autonomic abnormality is much less likely in this setting.  2. History of a vasovagal syncope, question autonomic dysfunction.  3. History of migraine.   PLAN:  Given her comorbid migraine, I think she would benefit from being  on an anticonvulsant, which would treat both the problems.  I also  recommended loading with Depakote, 1 g IV and then maintenance dose of  500 mg b.i.d.  We will arrange for an outpatient workup through our  office including MRI and EEG with other EEG testing depending on the  results of these.  I will see her back in a few weeks for a followup as  well.      Michael L. Thad Ranger, M.D.  Electronically Signed     MLR/MEDQ  D:  08/06/2008  T:  08/07/2008  Job:  [id number]

## 2011-04-06 NOTE — Discharge Summary (Signed)
NAMESOPHIEA, UEDA                ACCOUNT NO.:  [id number]   MEDICAL RECORD NO.:  [id number]          PATIENT TYPE:  INP   LOCATION:  4738                         FACILITY:  [facility]   PHYSICIAN:  Jesse Sans. Wall, MD, FACCDATE OF BIRTH:  [birth year]   DATE OF ADMISSION:  07/12/2007  DATE OF DISCHARGE:  07/14/2007                               DISCHARGE SUMMARY   DISCHARGE DIAGNOSES:  1. Chest discomfort of uncertain etiology, does not appear to be      cardiac origin, syncope with a long history of syncope.  2. Hypertension.  3. Constipation.  4. History as previously.   BRIEF HISTORY:  Ms. Waldo is a 40 year old white female who presented  with chest discomfort that she described as heaviness under her left  breast.  She has had this intermittently for a week.  It is fleeting in  nature but hits her very suddenly.  However, the night prior to  admission it suddenly hit her with a very intense pain, doubling her  over.  This was associated with difficulty taking any deep breath and  lasted for about 30 minutes on the evening prior to admission.  She took  her blood pressure medication and her stomach medication without  difficulty.  When she got up to use the bathroom on the morning of  presentation, the discomfort had returned.  She states that stood up to  walk to the bathroom and everything went black and she discovered that  she was lying on her right side.  Her boyfriend states she was out 2-3  minutes.   This is the third syncopal episode that she has had in the preceding 2  years.  Usually syncopal episodes have been triggered in the past by of  emotional or physical discomfort.  She has a history of recurring  syncope with a prior extensive neuro and cardiac workup as a teenager.  There is questioned diagnosis of autonomic dysfunction.  Last Myoview in  March 2008 was unremarkable.   PAST MEDICAL HISTORY:  1. Hypertension.  2. Remote GI bleed in this setting of heavy  nonsteroidals.  3. Diverticulitis.  4. Hiatal hernia.  5. Ovarian cyst.  6. Migraines.  7. Tubal ligation.   LABORATORY:  Chest x-ray on the 20th did not show any active disease.  EKG showed normal sinus rhythm, normal axis, delayed R-wave, nonspecific  ST-T wave changes.  On admission I-STAT showed an H&H 11.2 and 33.0.  Sodium 138, potassium 3.8, BUN 6, creatinine 0.8.  D-dimer less than  0.22.  Prior to discharge on the 21st H&H was 10.4 and 31.3, normal  indices, platelets 319, WBC 7.2.  PTT 28, PT 13.1.  Sodium 141,  potassium 3.8, BUN 11, creatinine 0.69.  normal LFTs except protein and  albumin were low at 5.6 and 3.0.  CK, MBs, relative indices and  troponins were within normal limits x3.  Total cholesterol 185,  triglycerides 103, cholesterol 40, LDL 124.  TSH 3.160.  She was not  pregnant.  Urine drug screen was unremarkable.   HOSPITAL COURSE:  The patient was evaluated  by Dorian Pod, ACNP,  and Dr. Gala Romney, admitted for 23-hour observation.  Overnight she  continued to have some chest discomfort and she was receiving morphine  for the discomfort with improvement.  Dysrhythmia was not documented.  An echocardiogram was performed on the 21st and revealed an EF of 55-60%  without wall motion abnormalities, mild MR.  Orthostatics were obtained  and also did not show any changes.  On the 22nd Dr. Daleen Squibb reviewed.  The  patient had multiple complaints.  When Dr. Daleen Squibb walked in the room she  stated it was about time that he got here to see her.  She was also  complaining of constipation.  Dr. Daleen Squibb felt that her constipation was  related to the repeated morphine use.  She states she was having so much  constipation, she was beginning to get a migraine.  She was also  complaining about not getting a laxative despite having cardiology  p.r.n. orders on her chart.  Dr. Daleen Squibb felt that her chest discomfort was  not cardiac-related.  He felt that her blood pressure was slightly   elevated due to her discomfort.  He felt she could be discharged home  with outpatient follow-up with her primary care physician.   DISPOSITION:  Dr. Daleen Squibb discharged the patient home.  He felt that she  could follow up with her primary care physician [facility] and Dr.  Antoine Poche as needed.  Her prescriptions include:   1. Protonix 40 mg daily once a day for 4 weeks.  2. Hydrochlorothiazide 25 mg daily.  3. Potassium chloride 10 mEq daily.   DISCHARGE TIME:  35 minutes.      Joellyn Rued, PA-C      Jesse Sans. Daleen Squibb, MD, [facility]  Electronically Signed    EW/MEDQ  D:  07/14/2007  T:  07/14/2007  Job:  [id number]

## 2011-04-09 NOTE — Procedures (Signed)
[place]. [facility]  Patient:    Tamara Frazier, Tamara Frazier                    MRN: [id number] Proc. Date: 08/22/00 Adm. Date:  [id number] Attending:  Nelda Marseille CC:         Arvella Merles, M.D.   Procedure Report  PROCEDURE:  Colonoscopy.  ENDOSCOPIST:  Petra Kuba, M.D.  INDICATION:  Presumed lower GI bleeding, symptomatic anemia.  INFORMED CONSENT:  Consent was signed after risks, benefits, methods and options were thoroughly discussed with the patient and her family members.  MEDICINES USED:  Demerol 50 mg, Versed 7 mg.  DESCRIPTION OF PROCEDURE:  Rectal inspection was pertinent for small external hemorrhoids.  Digital exam was negative.  Pediatric videocolonoscope was inserted and easily advanced around the colon to the cecum.  No blood was seen on insertion.  Some occasional left and right scattered diverticula were seen. To advance into the cecum required some left lower quadrant pressure and no position changes.  Cecum was identified by the appendiceal orifice and the ileocecal valve; in fact, the scope was inserted a short ways into the terminal ileum, which was normal.  There was no blood seen coming from above. Scope was slowly withdrawn.  Prep was adequate.  There was minimal liquid stool that required washing and suctioning.  On slow withdrawal through the colon, other than the rare occasional scattered diverticula, no other abnormalities were seen.  Once back in the rectum, the scope was then retroflexed, revealing tiny internal hemorrhoids.  Scope was then reinserted a short ways up the sigmoid, air was suctioned and scope removed.  Patient tolerated procedure well.  There were no obvious immediate complications.  ENDOSCOPIC DIAGNOSES 1. Internal and external hemorrhoids, tiny. 2. Left and right occasional diverticula throughout. 3. Otherwise within normal limits to the terminal ileum without blood seen    throughout  exam.  PLAN:  Diverticular information.  Continue workup with an EGD. DD:  08/22/00 TD:  08/22/00 Job: [id number]

## 2011-04-09 NOTE — Procedures (Signed)
[facility]                              EXERCISE Tamara, Frazier                         MRN:          [id number]  DATE:02/06/2007                            DOB:          November 01, 1971    PROCEDURE:  Exercise treadmill test.   CARDIOLOGIST:  Rollene Rotunda, M.D.   INDICATIONS FOR PROCEDURE:  Evaluate patient for chest pain and  cardiovascular risk factors.   DESCRIPTION OF PROCEDURE:  The patient was exercised using the standard  Bruce protocol.  We were able to exercise her for 10 minutes and 31  seconds.  The test was terminated because of fatigue.  She did achieve a  target heart rate with a maximum of 193, which is 103% of predicted.  She achieved 12.5 mets.  She had an appropriate blood pressure response  to a maximum of 166/94.  She had no chest pain.  There were no ischemic  ST-T wave changes.  There was no ectopy.  She had a normal heart rate  recovery.   CONCLUSION:  Negative adequate exercise treadmill test.  She has an  excellent exercise tolerance.   PLAN:  Based on the above, the patient is in the low risk category for  future cardiovascular events.  We discussed an exercise prescription  based on this.  She will continue with this and risk reduction.  Because  I decreased her hydrochlorothiazide, I am checking a BMET today.  I am  also checking a urinalysis to evaluate her hypertension.   FOLLOWUP:  I would like to see her back in about three months, or sooner  if needed.     Rollene Rotunda, MD, [facility]  Electronically Signed    JH/MedQ  DD: 02/06/2007  DT: 02/07/2007  Job #: [id number]   cc:   Gabriel Earing, M.D.

## 2011-04-09 NOTE — H&P (Signed)
[place]. [facility]  Patient:    Tamara Frazier, Tamara Frazier                    MRN: [id number] Adm. Date:  [id number] Attending:  Cathren Laine CC:         [name] [name], M.D.   History and Physical  HISTORY OF PRESENT ILLNESS:  Patient with a week-long history of maroon stools.  She had a bad episode last Sunday that came on all of a sudden, with some urgency.  Went to [facility].  Supposedly, some blood was drawn, but they told her it was coming from her rectum.  She was not sure if it was related to her recent pregnancy and tubal ligation, which occurred four months ago.  She then saw Dr. Nicholos Johns on Wednesday and, supposedly, an endoscopy was done, which ruled out any hemorrhoids.  She only had minimal dark stools throughout the week and actually felt fine.  Her energy was not quite 100%, but she was able to function and work, when she woke up this morning and had a second bout of significant bleeding and urgency with some near syncope and presented to the emergency room.  She was found to have maroon stools and a hemoglobin of 7.  Dr. Nicholos Johns was called and asked me to see the patient.  She had been on a fair amount of ibuprofen, a few days a few times a week, but has no history of anemia.  Dr. Nicholos Johns did not do any blood work in his office, and I do not have currently the [facility] laboratories.  She has not had any previous GI problems and has not had any previous bouts of bleeding, diarrhea, etc.  PAST MEDICAL HISTORY: 1. Tubal ligation. 2. Three normal childbirths.  SOCIAL HISTORY:  Does not drink or smoke.  MEDICATIONS:  Uses ibuprofen, but no other over-the-counter medicines, except for a prenatal vitamin.  ALLERGIES:  No known allergies.  FAMILY HISTORY:  Negative for any GI problems except for some gallbladder problems in some relatives and her dad with pancreas problems, she said, which are possibly due to a birth defect.  REVIEW  OF SYSTEMS:  Feeling weak and dizzy, with the two episodes of bleeding, and feeling weaker when she stands up today, but no history of anemia or other problems, as above.  PHYSICAL EXAMINATION:  VITAL SIGNS:  See chart.  She actually was not orthostatic per the nurse.  HEENT:  Sclerae nonicteric.  NECK:  Supple.  Without obvious lymphadenopathy.  LUNGS:  Clear.  HEART:  Regular rate and rhythm.  ABDOMEN:  Soft and nontender.  Good bowel sounds.  RECTAL:  By the ER physician was pertinent for maroon stool.  EXTREMITIES:  No pedal edema.  Good peripheral pulses.  LABORATORY DATA:  White count of 8.3 with a hemoglobin of 7.3, MCV 89, platelets 362.  PT 13.8, PTT 23.  Chemistries entirely normal including BUN of 19, creatinine of 0.6.  ASSESSMENT: 1. Four months postpartum and tubal ligation. 2. Gastrointestinal bleed, probably lower, although on ibuprofen. 3. Anemia.  PLAN:  The risks and benefits of colonoscopy and, if negative, endoscopy, were discussed.  Will proceed tomorrow at 7:30 unless needed sooner p.r.n.  I have discussed the risks versus benefits of transfusion with the patient, as well as her aunt, and she would like to hold off for now, but she has Dorette Grate it if it drops any further or if her symptoms  increase.  We will also try to get her CBC from [facility], if done, to see how far she has dropped, with further workup and plans pending findings tomorrow or continual bleeding.  We did warn her about her breast-feeding, how she may want to pump before the procedure, but then she will need to talk to her lactation consultant to see when it is okay to resume breast-feeding based on the Demerol and Versed we are giving her, but my experience has been probably two to three days. DD:  08/21/00 TD:  08/21/00 Job: [id number]

## 2011-04-09 NOTE — Assessment & Plan Note (Signed)
[facility]                            CARDIOLOGY OFFICE NOTE   Tamara Frazier, Tamara Frazier                         MRN:          [id number]  DATE:01/13/2007                            DOB:          March 21, 1971    PRIMARY CARE PHYSICIAN:  Dr. Gabriel Earing.   REASON FOR VISIT:  Patient with chest pain, hypertension and  palpitations.   HISTORY OF PRESENT ILLNESS:  The patient is a pleasant, 40 year old,  white female with a history of palpitations in 2006. She was admitted to  the hospital with dizziness and hypertension at that time. She has had  some infrequent chest discomfort over the couple of years until February  11. She developed chest discomfort in the evening while going to pick up  her daughter. This was a pain under her right breast and radiating over  to the left side. It was moderate in intensity. It slowly improved. She  described it as a substernal pressure like an elephant sitting on her  chest. However, when she awoke the next morning she had recurrent  discomfort that was there throughout the day. She presented to [facility] where she was noted to be significantly hypertensives with  systolics in the 100s and diastolics in the 110 range. She had  sublingual nitroglycerin without improvement in symptoms. She  subsequently went to the emergency room at [facility]. There she eventually  had clonidine. She said the pain slowly eased over several hours in the  ER. She had negative cardiac enzymes and eventually left the emergency  room. Since then she has had none of these severe episodes. She still  gets occasional chest discomfort. This happens sporadically. She does  walk although she has not done a lot of this since her ER visit. She  does not think this brings on the discomfort. She has no jaw discomfort  or arm discomfort. She does occasionally notice palpitations but has had  no presyncope or syncope. She does not take her blood pressure at  home.  She was treated with clonidine in the emergency room but this causes  extreme fatigue and so she stopped this.   PAST MEDICAL HISTORY:  1. Hypertension x1 year.  2. Diverticulitis.  3. Hydrocodone.   MEDICATIONS:  1. Hydrochlorothiazide 12.5 mg daily.  2. Amlodipine 5 mg daily.  3. Clonidine (the patient stopped this).   SOCIAL HISTORY:  The patient is an admissions counselor. She is married.  She has 3 children ages 109, 57 and 72. She smoked a few cigarettes as a  teenager.   FAMILY HISTORY:  Contributory for her father having his myocardial  infarction in his 30s. He has an ischemic cardiomyopathy.   REVIEW OF SYSTEMS:  Positive for migraines, dyspnea with significant  exertion. Negative for other systems.   PHYSICAL EXAMINATION:  GENERAL:  The patient is well-appearing and in no  distress.  VITAL SIGNS:  Blood pressure 145/94, heart rate 76 and regular, weight  160 pounds, body mass index 27.  HEENT:  Eyes lids unremarkable. Pupils equal round and reactive to  light. Fundi  not visualized. Oral mucosa unremarkable.  NECK:  No jugular venous distention at 45 degrees, carotid upstroke  brisk and symmetric, no bruits, no thyromegaly.  LYMPHATICS:  No cervical, axillary or inguinal adenopathy.  LUNGS:  Clear to auscultation bilaterally.  BACK:  No costovertebral angle tenderness.  CHEST:  Unremarkable.  HEART:  PMI not displaced or sustained, S1 and S2 within normal limits,  no S3, no S4, no clicks, no rubs, no murmurs.  ABDOMEN:  Flat, positive bowel sounds, normal to frequency and pitch, no  bruits, no rebound, no guarding, no midline pulsatile mass, no  hepatomegaly, no splenomegaly.  SKIN:  No rashes, no nodules.  EXTREMITIES:  2+ pulses throughout, no edema, no cyanosis, no clubbing.  NEUROLOGIC:  Oriented to person, place and time. Cranial nerves II-XII  grossly intact. Motor grossly intact.   EKG sinus rhythm, axis within normal limits, interval is within  normal  limits, pointing to her R wave progression, no acute ST-T wave changes.   ASSESSMENT/PLAN:  1. Chest discomfort. The patient's chest discomfort is somewhat      atypical. However, she has a very significant family history.      Therefore, I am going to bring her back for an exercise treadmill      test. This will allow me to evaluate for obstructive coronary      disease, risk stratify and give her a prescription for exercise.  2. Hypertension. Blood pressure is still slightly elevated. I have      instructed her on how to keep a blood pressure diary and asked her      to buy a blood pressure cuff. I am going to check a BMET today. I      am going to also look for secondary causes by having her get a      urinalysis when she comes back. I will check a TSH today. I will      have a low threshold for an aldosterone level as she has had some      hypokalemia in the past. I am going to have her increase her      hydrochlorothiazide to 25 mg daily. We discussed potassium      containing foods and I suggested a salt substitute. I am also going      to have her get a prescription of 10 mEq potassium. I will call her      if she needs to start taking this.  3. Followup. Will see her at the time of her treadmill.     Rollene Rotunda, MD, [facility]  Electronically Signed    JH/MedQ  DD: 01/13/2007  DT: 01/13/2007  Job #: [id number]   cc:   Gabriel Earing, M.D.

## 2011-04-09 NOTE — Procedures (Signed)
[place]. [facility]  Patient:    Tamara Frazier, Tamara Frazier                    MRN: [id number] Proc. Date: 08/22/00 Adm. Date:  [id number] Attending:  Nelda Marseille CC:         Petra Kuba, M.D.  Arvella Merles, M.D.   Procedure Report  PROCEDURE: Esophagogastroduodenoscopy.  ENDOSCOPIST: Petra Kuba, M.D.  INDICATIONS FOR PROCEDURE: Gastrointestinal bleeding, questionable diverticula, nondiagnostic colonoscopy.  Consent was signed after the risks and benefits, methods and options of surgery were discussed with the patient and family members prior to any sedation.  ADDITIONAL MEDICATIONS: Demerol 20 mg, Versed 2 mg.  DESCRIPTION OF PROCEDURE: The video endoscope was inserted by direct vision. The esophagus was normal.  In the distal esophagus a tiny to small hiatal hernia was seen.  The scope was passed into the stomach and then through a normal antrum and normal pylorus into a normal duodenal bulb and around to a normal second and probably third part of the duodenum.  No blood was seen distally or on insertion.  The scope was slowly withdrawn back to the bulb and no additional findings were seen.  There were no ulcers in the bulb.  The scope was withdrawn back to the stomach and retroflexed high into the cardia and the hiatal hernia was confirmed.  The angularis, fundus, lesser and greater curvature were normal on retroflex visualization.  The scope was straightened and straight visualization of the stomach was normal.  Air was suctioned and the scope removed.  Again a good look at the esophagus on slow withdrawal was normal.  The scope was removed.  The patient tolerated the procedure well and there were no immediate complications.  ENDOSCOPIC DIAGNOSES:  1. Tiny to small hiatal hernia.  2. Otherwise within normal limits esophagogastroduodenoscopy without any     blood being seen.  PLAN: Considered a one-time Small Bowel Followthrough as  an outpatient to be completed.  Okay to go home later today if no further bleeding and able to advance diet.  No aspirin or nonsteroidals.  Use Tylenol only.  Will begin iron b.i.d.  See back in a month if not needed sooner p.r.n. to recheck guaiac, CBC, and make sure no further work-up plan is needed, and have the patient call me sooner p.r.n. DD:  08/22/00 TD:  08/22/00 Job: [id number]

## 2011-04-09 NOTE — H&P (Signed)
NAME:  Tamara Frazier, Tamara Frazier                ACCOUNT NO.:  [id number]   MEDICAL RECORD NO.:  [id number]          PATIENT TYPE:  EMS   LOCATION:  MAJO                         FACILITY:  [facility]   PHYSICIAN:  Kela Millin, M.D.DATE OF BIRTH:  May 15, 1971   DATE OF ADMISSION:  06/15/2005  DATE OF DISCHARGE:                                HISTORY & PHYSICAL   PRIMARY CARE PHYSICIAN:  ?Leonides Sake, M.D. ([facility]   CHIEF COMPLAINT:  Dizziness.   HISTORY OF PRESENT ILLNESS:  The patient is a 40 year old white female with  past medical history significant for GI bleed/anemia who presents with  complaint of dizziness x1 day.  She states that she woke up at about 2 a.m.  this morning feeling lightheaded and dizzy.  In the ER she had a presyncopal  episode in going from lying to sitting.  She states she felt like her heart  was racing early this morning.  She denies chest pain, shortness of breath,  nausea, vomiting, diarrhea, fevers, cough, hemoptysis, melena, hematochezia,  headaches, dysuria, and also denies urinary frequency.   The patient was seen in the ER and per ER physician was noted to be  orthostatic with her pulse going from 96 to 119 just from lying to sitting.  The patient had a presyncopal episode at that time.  She had a urinalysis  which was consistent with a UTI.  Her D-dimer is within normal limits and  her chest x-ray showed no active cardiopulmonary disease.  Her H&H is within  normal limits and she is admitted for observation to the [facility] hospitalist  service.   PAST MEDICAL HISTORY:  1.  As stated above.  2.  Status post tubal ligation.   MEDICATIONS:  None.   ALLERGIES:  NKDA.   SOCIAL HISTORY:  She denies tobacco.  Also denies alcohol.   FAMILY HISTORY:  Noncontributory.   REVIEW OF SYSTEMS:  As per HPI.  Other review of systems negative.   PHYSICAL EXAMINATION:  GENERAL:  The patient is a young white female, alert  and  oriented x3, in no acute distress.  VITAL SIGNS:  Blood pressure lying 155/103 with a pulse of 95 and blood  pressure sitting 158/108 with a pulse of 119, respiratory rate 20.  Her  current blood pressure is 162/89 and her O2 saturation is 100%.  HEENT:  PERRL.  EOMI.  Sclerae anicteric.  Slightly dry mucous membranes.  No oral exudates.  NECK:  Supple.  No adenopathy.  No thyromegaly.  LUNGS:  Clear to auscultation bilaterally.  No crackles or wheezes.  CARDIOVASCULAR:  Regular rate and rhythm.  Normal S1, S2.  ABDOMEN:  Soft.  Bowel sounds present.  Nontender.  Nondistended.  No  organomegaly and no masses palpable.  EXTREMITIES:  No cyanosis or edema.  NEUROLOGIC:  Alert and oriented x3.  Cranial nerves II-XII grossly intact.  Strength 5/5 and symmetric.  Nonfocal examination.   LABORATORY DATA:  Chest x-ray:  No active cardiopulmonary disease.  Urinalysis:  Cloudy.  The urine nitrite is positive.  Leukocyte esterase  is  moderate.  Urine wbc's 0-2, many bacteria.  Her hemoglobin is 15.3 with a  hematocrit of 45.  Sodium is 140, potassium 3.5, chloride is 107, BUN 10,  glucose is 94.  Her pH is 7.48, pCO2 34, bicarbonate is 25.6 and her  creatinine is 0.9.  D-dimer is 0.35.  Urine pregnancy test is negative.   ASSESSMENT/PLAN:  1.  Dizziness/presyncope.  Patient orthostatic in the emergency room.      Physical examination with no focal neurologic findings.  Will monitor on      telemetry.  Will obtain an EKG and cardiac enzymes.  As noted above her      D-dimer is within normal limits.  Her chest x-ray was no active disease      and the H&H within normal limits.  Will hydrate and recheck orthostatics      in a.m.  2.  Probable urinary tract infection.  Will obtain urine cultures and start      empiric antibiotics.  3.  Elevated blood pressures.  Monitor blood pressures and if persistently      elevated treat as appropriate.       ACV/MEDQ  D:  06/15/2005  T:  06/15/2005  Job:   [id number]   cc:   Holley Bouche, M.D.  [address]  [place], [place] [address]  Fax: [phone]

## 2011-04-09 NOTE — Op Note (Signed)
[facility]  Patient:    Tamara Frazier, Tamara Frazier                    MRN: [id number] Proc. Date: 04/11/00 Attending:  Donne Hazel                           Operative Report  PREOPERATIVE DIAGNOSIS:       Request for permanent, voluntary sterilization.  POSTOPERATIVE DIAGNOSIS:      Request for permanent, voluntary sterilization.  OPERATION:                    Bilateral tubal ligation.  SURGEON:                      Willey Blade, M.D.  ASSISTANT:  ANESTHESIA:                   Spinal.  ESTIMATED BLOOD LOSS:         Less than 20 cc.  FINDINGS:                     Normal pelvis.  INDICATIONS:                  Risks and benefits of bilateral tubal ligation explained to the patient.  The fact that this is a permanent procedure with a failure rate of 1 in 200 explained thoroughly prior to the procedure.  DESCRIPTION OF PROCEDURE:     The patient was taken to the operating room where a spinal anesthetic was administered.  The patient was placed on the operating table in the supine position.  The abdomen was prepped and draped in the usual sterile fashion with Betadine and sterile drapes.  The abdomen was entered through a small transverse infraumbilical skin incision and carried down sharply in the usual fashion.  The peritoneum was atraumatically entered.  Two Army-Navy retractors ere placed inside the abdomen and the tubes were located.  First the left tube was identified and traced to its fimbriated end to ensure its positive identification. The tube was then grasped approximately 2 cm from the uterine fundus and through an avascular region of the mesosalpinx, a Mayo needle carrying one long strand of 1 plain suture was passed.  The #1 plain suture was then cut and an approximately 2.5 cm segment of tube was then tied off using the now two segments of #1 plain suture. An approximately 1.5 cm segment of tube was then excised between  the two existing ligatures.  A single ligature of 0 silk was placed on the medial tubal stump. he same procedure was repeated upon the right tube.  Good hemostasis was noted from the operative areas.  Attention was then turned to closure.  The fascia and anterior peritoneum was closed with a running stitch of 0 Vicryl.  The operative areas were hemostatic.  The skin was then reapproximated with a running subcuticular stitch of 3-0 Vicryl Rapide.  The patient was then taken to the recovery room in good condition.  There were no perioperative complications. Estimated blood loss was minimal. DD:  04/11/00 TD:  04/12/00 Job: [id number]

## 2011-07-08 ENCOUNTER — Encounter (HOSPITAL_COMMUNITY): Payer: Self-pay | Admitting: *Deleted

## 2011-07-08 ENCOUNTER — Inpatient Hospital Stay (HOSPITAL_COMMUNITY)
Admission: AD | Admit: 2011-07-08 | Discharge: 2011-07-08 | Disposition: A | Discharge status: Home / Self Care | Payer: BC Managed Care – PPO | Source: Ambulatory Visit | Attending: Obstetrics and Gynecology | Admitting: Obstetrics and Gynecology

## 2011-07-08 DIAGNOSIS — O4700 False labor before 37 completed weeks of gestation, unspecified trimester: Secondary | ICD-10-CM | POA: Insufficient documentation

## 2011-07-08 HISTORY — DX: Essential (primary) hypertension: I10

## 2011-07-08 LAB — URINALYSIS, ROUTINE W REFLEX MICROSCOPIC
Glucose, UA: 100 mg/dL — AB
Hgb urine dipstick: NEGATIVE
Leukocytes, UA: NEGATIVE
Specific Gravity, Urine: 1.025 (ref 1.005–1.030)

## 2011-07-08 LAB — URINE MICROSCOPIC-ADD ON

## 2011-07-08 NOTE — ED Provider Notes (Signed)
History     Chief Complaint  Patient presents with  . Contractions   HPI Contractions today, regular earlier, slowed down some since arrival to [facility]. Has UTI - dx yesterday, hasn't started meds yet. SVE: fingertip in office yesterday. Uncomplicated prenatal course. H/O SVD x 3, last delivery was C/S. Last vaginal delivery was precipitous - 20 minutes from 1 cm/AROM to delivery.   OB History    Grav Para Term Preterm Abortions TAB SAB Ect Mult Living   6 4 4       4       Past Medical History  Diagnosis Date  . Hypertension     Past Surgical History  Procedure Date  . Cesarean section     tubal reversal jan.2010    No family history on file.  History  Substance Use Topics  . Smoking status: Not on file  . Smokeless tobacco: Not on file  . Alcohol Use:     Allergies:  Allergies  Allergen Reactions  . Hydrocodone Nausea And Vomiting    Prescriptions prior to admission  Medication Sig Dispense Refill  . amLODipine (NORVASC) 10 MG tablet Take 10 mg by mouth daily.        . ferrous sulfate 325 (65 FE) MG tablet Take 325 mg by mouth daily with breakfast.        . hydrochlorothiazide 25 MG tablet Take 25 mg by mouth daily.        . phenazopyridine (PYRIDIUM) 95 MG tablet Take 95 mg by mouth 3 (three) times daily as needed.        . prenatal vitamin w/FE, FA (PRENATAL 1 + 1) 27-1 MG TABS Take 1 tablet by mouth daily.          Review of Systems  Constitutional: Negative.   HENT: Negative.   Eyes: Negative.   Genitourinary: Positive for dysuria. Frequency: improved with AZO.       + contractions, negative bleeding and LOF, + fetal movement    Physical Exam   Blood pressure 132/73, pulse 101, temperature 97.8 F (36.6 C), temperature source Oral, resp. rate 20, height 5\' 2"  (1.575 m), weight 88.055 kg (194 lb 2 oz).  Physical Exam  Constitutional: She is oriented to person, place, and time. She appears well-developed and well-nourished. No distress.    Cardiovascular: Normal rate.   Respiratory: Effort normal.  GI: Soft. There is no tenderness.  Genitourinary:       SVE: FT/thick/high  Musculoskeletal: Normal range of motion.  Neurological: She is alert and oriented to person, place, and time.  Skin: Skin is warm and dry.  Psychiatric: She has a normal mood and affect.   EFM: Reactive Toco: irreg UCs  MAU Course  Procedures  Consult with Dr. Renaldo Fiddler, may d/c home with no cervical change  Assessment and Plan  40 y.o. [id number] at [redacted]w[redacted]d Threatened preterm labor D/C home with precautions  Sahan Pen 07/08/2011, 9:27 PM

## 2011-07-08 NOTE — Progress Notes (Signed)
Patient is with c/o ctx but feels like the contractions have stopped now. She denies any vaginal bleeding,lof or discharge. She reports decreased fetal movement for a month but had a reassuring ultrasound yesterday at the doctors.

## 2011-07-08 NOTE — Progress Notes (Signed)
PT SAYS  UC WERE Q3-5 MIN.   BUT ARE LESS NOW.  CALLED OFFICE - TOLD TO COME IN.  YESTERDAY- IN OFFICE- HAD U/S , NST, . VE- FT.   DENIES HSV AND MRSA. DID GBS YESTERDAY.  HAS UTI-  CALLED OFFICE - CALLED IN RX- HAS NOT PICKED  UP RX YET..  BP - 142/97  IN OFFICE - IS TAKING 2 BP MEDS. NO H/A , BLURRED VISION, NO EPIGASTRIC PAIN NOW.

## 2011-07-19 ENCOUNTER — Inpatient Hospital Stay (HOSPITAL_COMMUNITY): Payer: BC Managed Care – PPO | Admitting: Anesthesiology

## 2011-07-19 ENCOUNTER — Inpatient Hospital Stay (HOSPITAL_COMMUNITY)
Admission: AD | Admit: 2011-07-19 | Discharge: 2011-07-21 | DRG: 372 | Disposition: A | Discharge status: Home / Self Care | Payer: BC Managed Care – PPO | Source: Ambulatory Visit | Attending: Obstetrics and Gynecology | Admitting: Obstetrics and Gynecology

## 2011-07-19 ENCOUNTER — Encounter (HOSPITAL_COMMUNITY): Payer: Self-pay | Admitting: Anesthesiology

## 2011-07-19 ENCOUNTER — Encounter (HOSPITAL_COMMUNITY): Payer: Self-pay

## 2011-07-19 DIAGNOSIS — O34219 Maternal care for unspecified type scar from previous cesarean delivery: Secondary | ICD-10-CM | POA: Diagnosis present

## 2011-07-19 DIAGNOSIS — O1002 Pre-existing essential hypertension complicating childbirth: Principal | ICD-10-CM | POA: Diagnosis present

## 2011-07-19 HISTORY — DX: Reserved for inherently not codable concepts without codable children: IMO0001

## 2011-07-19 HISTORY — DX: Encounter for other specified aftercare: Z51.89

## 2011-07-19 HISTORY — DX: Diverticulitis of intestine, part unspecified, without perforation or abscess without bleeding: K57.92

## 2011-07-19 LAB — COMPREHENSIVE METABOLIC PANEL
ALT: 9 U/L (ref 0–35)
AST: 14 U/L (ref 0–37)
Albumin: 2.6 g/dL — ABNORMAL LOW (ref 3.5–5.2)
Calcium: 9.3 mg/dL (ref 8.4–10.5)
GFR calc Af Amer: >60 mL/min (ref >60)
Sodium: 138 mEq/L (ref 135–145)
Total Protein: 5.7 g/dL — ABNORMAL LOW (ref 6.0–8.3)

## 2011-07-19 LAB — CBC
MCH: 31.3 pg (ref 26.0–34.0)
MCHC: 33.8 g/dL (ref 30.0–36.0)
Platelets: 235 10*3/uL (ref 150–400)

## 2011-07-19 MED ORDER — ZOLPIDEM TARTRATE 10 MG PO TABS: 10.0000 mg | ORAL_TABLET | Freq: Every evening | ORAL | Status: DC | PRN

## 2011-07-19 MED ORDER — OXYTOCIN BOLUS FROM INFUSION
500.0000 mL | Freq: Once | INTRAVENOUS | Status: DC
  Filled 2011-07-19: qty 500

## 2011-07-19 MED ORDER — LACTATED RINGERS IV SOLN
500.0000 mL | Freq: Once | INTRAVENOUS | Status: AC
  Administered 2011-07-19: 500 mL via INTRAVENOUS

## 2011-07-19 MED ORDER — PHENYLEPHRINE 40 MCG/ML (10ML) SYRINGE FOR IV PUSH (FOR BLOOD PRESSURE SUPPORT)
80.0000 ug | PREFILLED_SYRINGE | INTRAVENOUS | Status: DC | PRN
  Administered 2011-07-19: 80 ug via INTRAVENOUS
  Filled 2011-07-19 (×3): qty 5

## 2011-07-19 MED ORDER — ONDANSETRON HCL 4 MG/2ML IJ SOLN
4.0000 mg | Freq: Four times a day (QID) | INTRAMUSCULAR | Status: DC | PRN
  Administered 2011-07-19: 4 mg via INTRAVENOUS
  Filled 2011-07-19: qty 2

## 2011-07-19 MED ORDER — LACTATED RINGERS IV SOLN: 500.0000 mL | INTRAVENOUS | Status: DC | PRN

## 2011-07-19 MED ORDER — BUTORPHANOL TARTRATE 2 MG/ML IJ SOLN: 1.0000 mg | INTRAMUSCULAR | Status: DC | PRN

## 2011-07-19 MED ORDER — CITRIC ACID-SODIUM CITRATE 334-500 MG/5ML PO SOLN
30.0000 mL | ORAL | Status: DC | PRN
  Administered 2011-07-19: 30 mL via ORAL
  Filled 2011-07-19: qty 15

## 2011-07-19 MED ORDER — LIDOCAINE HCL 1.5 % IJ SOLN
INTRAMUSCULAR | Status: DC | PRN
  Administered 2011-07-19 (×2): 5 mL via EPIDURAL

## 2011-07-19 MED ORDER — TERBUTALINE SULFATE 1 MG/ML IJ SOLN: 0.2500 mg | Freq: Once | INTRAMUSCULAR | Status: AC | PRN

## 2011-07-19 MED ORDER — HYDROCHLOROTHIAZIDE 25 MG PO TABS
25.0000 mg | ORAL_TABLET | Freq: Every day | ORAL | Status: DC
  Administered 2011-07-20 – 2011-07-21 (×2): 25 mg via ORAL
  Filled 2011-07-19 (×3): qty 1

## 2011-07-19 MED ORDER — FENTANYL 2.5 MCG/ML BUPIVACAINE 1/10 % EPIDURAL INFUSION (WH - ANES)
14.0000 mL/h | INTRAMUSCULAR | Status: DC
  Administered 2011-07-19 – 2011-07-20 (×3): 14 mL/h via EPIDURAL
  Filled 2011-07-19 (×3): qty 60

## 2011-07-19 MED ORDER — PHENYLEPHRINE 40 MCG/ML (10ML) SYRINGE FOR IV PUSH (FOR BLOOD PRESSURE SUPPORT)
80.0000 ug | PREFILLED_SYRINGE | INTRAVENOUS | Status: DC | PRN
  Filled 2011-07-19: qty 5

## 2011-07-19 MED ORDER — ACETAMINOPHEN 325 MG PO TABS: 650.0000 mg | ORAL_TABLET | ORAL | Status: DC | PRN

## 2011-07-19 MED ORDER — AMLODIPINE BESYLATE 10 MG PO TABS
10.0000 mg | ORAL_TABLET | Freq: Every day | ORAL | Status: DC
  Administered 2011-07-20 – 2011-07-21 (×2): 10 mg via ORAL
  Filled 2011-07-19 (×4): qty 1

## 2011-07-19 MED ORDER — OXYTOCIN 20 UNITS IN LACTATED RINGERS INFUSION - SIMPLE
125.0000 mL/h | INTRAVENOUS | Status: AC
  Filled 2011-07-19: qty 1000

## 2011-07-19 MED ORDER — IBUPROFEN 600 MG PO TABS
600.0000 mg | ORAL_TABLET | Freq: Four times a day (QID) | ORAL | Status: DC | PRN
  Administered 2011-07-20: 600 mg via ORAL
  Filled 2011-07-19 (×2): qty 1

## 2011-07-19 MED ORDER — DIPHENHYDRAMINE HCL 50 MG/ML IJ SOLN: 12.5000 mg | INTRAMUSCULAR | Status: DC | PRN

## 2011-07-19 MED ORDER — OXYTOCIN 20 UNITS IN LACTATED RINGERS INFUSION - SIMPLE
1.0000 m[IU]/min | INTRAVENOUS | Status: DC
  Administered 2011-07-19: 1 m[IU]/min via INTRAVENOUS
  Administered 2011-07-20: 125 mL/h via INTRAVENOUS
  Administered 2011-07-20: 333 m[IU]/min via INTRAVENOUS
  Filled 2011-07-19: qty 1000

## 2011-07-19 MED ORDER — OXYCODONE-ACETAMINOPHEN 5-325 MG PO TABS
2.0000 | ORAL_TABLET | ORAL | Status: DC | PRN
  Filled 2011-07-19 (×2): qty 1

## 2011-07-19 MED ORDER — FLEET ENEMA 7-19 GM/118ML RE ENEM: 1.0000 | ENEMA | RECTAL | Status: DC | PRN

## 2011-07-19 MED ORDER — LIDOCAINE HCL (PF) 1 % IJ SOLN
30.0000 mL | INTRAMUSCULAR | Status: DC | PRN
  Filled 2011-07-19 (×2): qty 30

## 2011-07-19 MED ORDER — EPHEDRINE 5 MG/ML INJ
10.0000 mg | INTRAVENOUS | Status: DC | PRN
  Filled 2011-07-19: qty 4

## 2011-07-19 MED ORDER — LACTATED RINGERS IV SOLN
INTRAVENOUS | Status: DC
  Administered 2011-07-19: 22:00:00 via INTRAVENOUS

## 2011-07-19 MED ORDER — EPHEDRINE 5 MG/ML INJ
10.0000 mg | INTRAVENOUS | Status: DC | PRN
  Filled 2011-07-19 (×3): qty 4

## 2011-07-19 NOTE — H&P (Signed)
Tamara Frazier is a 40 y.o. female presenting for Induction of labor due to Chronic hypertension, persistent headaches and probable superimposed preeclampsia.   She has had labile BPs in the last several weeks not controlled on her BP meds.  Increase of 5 lbs weight in 4 days and a severe HA x 1 weeks not responding to tylenol.  Prev C/S for breech, she desires TOL for VBAC and has a history of a 20 minute labor with her last SVD.  Denies scotomata, RUQ pain.  GBS - History OB History    Grav Para Term Preterm Abortions TAB SAB Ect Mult Living   6 4 4       4      Past Medical History  Diagnosis Date  . Hypertension   . Blood transfusion   . Diverticulitis    Past Surgical History  Procedure Date  . Cesarean section     tubal reversal jan.2010  . Tubal ligation    Family History: family history is not on file. Social History:  does not have a smoking history on file. She does not have any smokeless tobacco history on file. She reports that she does not drink alcohol or use illicit drugs.  ROS    There were no vitals taken for this visit. Exam Physical Exam  Prenatal labs: ABO, Rh: B/Negative/-- (02/09 0000) Antibody: Negative (02/09 0000) Rubella:   RPR:    HBsAg: Negative (02/09 0000)  HIV: Non-reactive (02/09 0000)  GBS:     Assessment/Plan: IUP at 37 weeks with CHTN and superimposed PIH  Check PIH labs.  No cns sxs now Prev [facility]CTC for TOL.  R&B discussed at length and informed consent obtained. Pitocin and AROM Epidural first since history of 20 minute labor after AROM  Zoey Bidwell C 07/19/2011, 6:31 PM

## 2011-07-19 NOTE — Anesthesia Procedure Notes (Signed)

## 2011-07-19 NOTE — Anesthesia Preprocedure Evaluation (Signed)
Anesthesia Evaluation  Name, MR# and DOB Patient awake  General Assessment Comment  Reviewed: Allergy & Precautions, H&P , Patient's Chart, lab work & pertinent test results  Airway Mallampati: II TM Distance: >3 FB Neck ROM: full    Dental No notable dental hx.    Pulmonary  clear to auscultation  pulmonary exam normalPulmonary Exam Normal breath sounds clear to auscultation none    Cardiovascular hypertension, regular Normal    Neuro/Psych Negative Neurological ROS  Negative Psych ROS  GI/Hepatic/Renal negative GI ROS  negative Liver ROS  negative Renal ROS        Endo/Other  Negative Endocrine ROS (+)      Abdominal   Musculoskeletal   Hematology negative hematology ROS (+)   Peds  Reproductive/Obstetrics (+) Pregnancy    Anesthesia Other Findings             Anesthesia Physical Anesthesia Plan  ASA: III  Anesthesia Plan: Epidural   Post-op Pain Management:    Induction:   Airway Management Planned:   Additional Equipment:   Intra-op Plan:   Post-operative Plan:   Informed Consent: I have reviewed the patients History and Physical, chart, labs and discussed the procedure including the risks, benefits and alternatives for the proposed anesthesia with the patient or authorized representative who has indicated his/her understanding and acceptance.     Plan Discussed with:   Anesthesia Plan Comments:         Anesthesia Quick Evaluation  

## 2011-07-19 NOTE — Progress Notes (Signed)
Dr. Rana Snare at beside. Orders received to recheck patient in an hour and start pitocin if patient has not dilated further.

## 2011-07-20 ENCOUNTER — Encounter (HOSPITAL_COMMUNITY): Payer: Self-pay

## 2011-07-20 LAB — CBC
HCT: 32.1 % — ABNORMAL LOW (ref 36.0–46.0)
Hemoglobin: 10.8 g/dL — ABNORMAL LOW (ref 12.0–15.0)
MCH: 31.3 pg (ref 26.0–34.0)
MCHC: 33.6 g/dL (ref 30.0–36.0)
RBC: 3.45 MIL/uL — ABNORMAL LOW (ref 3.87–5.11)

## 2011-07-20 LAB — ABO/RH: ABO/RH(D): B POS

## 2011-07-20 MED ORDER — SIMETHICONE 80 MG PO CHEW: 80.0000 mg | CHEWABLE_TABLET | ORAL | Status: DC | PRN

## 2011-07-20 MED ORDER — IBUPROFEN 600 MG PO TABS
600.0000 mg | ORAL_TABLET | Freq: Four times a day (QID) | ORAL | Status: DC
  Administered 2011-07-20 – 2011-07-21 (×3): 600 mg via ORAL
  Filled 2011-07-20 (×2): qty 1

## 2011-07-20 MED ORDER — BENZOCAINE-MENTHOL 20-0.5 % EX AERO
INHALATION_SPRAY | CUTANEOUS | Status: AC
  Administered 2011-07-20: 1 via TOPICAL
  Filled 2011-07-20: qty 56

## 2011-07-20 MED ORDER — WITCH HAZEL-GLYCERIN EX PADS: 1.0000 "application " | MEDICATED_PAD | CUTANEOUS | Status: DC | PRN

## 2011-07-20 MED ORDER — OXYCODONE-ACETAMINOPHEN 5-325 MG PO TABS
1.0000 | ORAL_TABLET | ORAL | Status: DC | PRN
  Administered 2011-07-20 – 2011-07-21 (×3): 1 via ORAL
  Filled 2011-07-20 (×5): qty 2

## 2011-07-20 MED ORDER — MISOPROSTOL 200 MCG PO TABS
800.0000 ug | ORAL_TABLET | Freq: Once | ORAL | Status: AC
  Administered 2011-07-20: 800 ug via RECTAL

## 2011-07-20 MED ORDER — SENNOSIDES-DOCUSATE SODIUM 8.6-50 MG PO TABS
2.0000 | ORAL_TABLET | Freq: Every day | ORAL | Status: DC
  Administered 2011-07-20: 2 via ORAL

## 2011-07-20 MED ORDER — BUPIVACAINE HCL (PF) 0.25 % IJ SOLN
INTRAMUSCULAR | Status: DC | PRN
  Administered 2011-07-20: 10 mL

## 2011-07-20 MED ORDER — ONDANSETRON HCL 4 MG PO TABS: 4.0000 mg | ORAL_TABLET | ORAL | Status: DC | PRN

## 2011-07-20 MED ORDER — PRENATAL PLUS 27-1 MG PO TABS
1.0000 | ORAL_TABLET | Freq: Every day | ORAL | Status: DC
  Administered 2011-07-21: 1 via ORAL
  Filled 2011-07-20: qty 1

## 2011-07-20 MED ORDER — DIBUCAINE 1 % RE OINT: 1.0000 "application " | TOPICAL_OINTMENT | RECTAL | Status: DC | PRN

## 2011-07-20 MED ORDER — MEDROXYPROGESTERONE ACETATE 150 MG/ML IM SUSP: 150.0000 mg | INTRAMUSCULAR | Status: DC | PRN

## 2011-07-20 MED ORDER — BENZOCAINE-MENTHOL 20-0.5 % EX AERO
1.0000 "application " | INHALATION_SPRAY | CUTANEOUS | Status: DC | PRN
  Administered 2011-07-20: 1 via TOPICAL

## 2011-07-20 MED ORDER — OXYTOCIN 20 UNITS IN LACTATED RINGERS INFUSION - SIMPLE: 125.0000 mL/h | INTRAVENOUS | Status: DC | PRN

## 2011-07-20 MED ORDER — LANOLIN HYDROUS EX OINT: TOPICAL_OINTMENT | CUTANEOUS | Status: DC | PRN

## 2011-07-20 MED ORDER — DIPHENHYDRAMINE HCL 25 MG PO CAPS
25.0000 mg | ORAL_CAPSULE | Freq: Four times a day (QID) | ORAL | Status: DC | PRN
Start: 2011-07-20 — End: 2011-07-21

## 2011-07-20 MED ORDER — ZOLPIDEM TARTRATE 5 MG PO TABS: 5.0000 mg | ORAL_TABLET | Freq: Every evening | ORAL | Status: DC | PRN

## 2011-07-20 MED ORDER — MEASLES, MUMPS & RUBELLA VAC ~~LOC~~ INJ
0.5000 mL | INJECTION | Freq: Once | SUBCUTANEOUS | Status: DC
  Filled 2011-07-20: qty 0.5

## 2011-07-20 MED ORDER — NALOXONE HCL 0.4 MG/ML IJ SOLN
INTRAMUSCULAR | Status: AC
  Filled 2011-07-20: qty 1

## 2011-07-20 MED ORDER — TETANUS-DIPHTH-ACELL PERTUSSIS 5-2.5-18.5 LF-MCG/0.5 IM SUSP: 0.5000 mL | Freq: Once | INTRAMUSCULAR | Status: DC

## 2011-07-20 MED ORDER — MISOPROSTOL 200 MCG PO TABS
ORAL_TABLET | ORAL | Status: AC
  Filled 2011-07-20: qty 4

## 2011-07-20 MED ORDER — OXYTOCIN 10 UNIT/ML IJ SOLN
INTRAMUSCULAR | Status: AC
  Filled 2011-07-20: qty 2

## 2011-07-20 NOTE — Progress Notes (Signed)
Dr Brayton Caves at bedside to redose pt

## 2011-07-20 NOTE — Progress Notes (Signed)
Post Partum Day 0 Subjective: no complaints, experienced heavy bleeding earlier, currently on Pitocin drip  Objective: Blood pressure 124/75, pulse 78, temperature 98.8 F (37.1 C), temperature source Oral, resp. rate 20, height 5\' 3"  (1.6 m), weight 90.719 kg (200 lb), SpO2 97.00%, unknown if currently breastfeeding.  Physical Exam:  General: alert and cooperative Lochia: appropriate Uterine Fundus: firm Perineum intact DVT Evaluation: No evidence of DVT seen on physical exam.   Basename 07/20/11 0634 07/19/11 1836  HGB 10.8* 11.8*  HCT 32.1* 34.9*    Assessment/Plan: Continue current plan of care   LOS: 1 day   Gavyn Ybarra G 07/20/2011, 9:17 AM

## 2011-07-20 NOTE — Progress Notes (Signed)
Dr Rana Snare notified of sve and that pt would like to be redosed, says that is all right and that he will be in for delivery

## 2011-07-20 NOTE — Progress Notes (Signed)
Dr. Rana Snare requests that pitocin remain running when patient is transferred to [facility]

## 2011-07-21 LAB — CBC
HCT: 31 % — ABNORMAL LOW (ref 36.0–46.0)
Hemoglobin: 10.6 g/dL — ABNORMAL LOW (ref 12.0–15.0)
MCHC: 34.2 g/dL (ref 30.0–36.0)
MCV: 92.8 fL (ref 78.0–100.0)
RDW: 14.6 % (ref 11.5–15.5)

## 2011-07-21 MED ORDER — AMLODIPINE BESYLATE 10 MG PO TABS: 10.0000 mg | ORAL_TABLET | Freq: Every day | ORAL | Status: DC

## 2011-07-21 MED ORDER — OXYCODONE-ACETAMINOPHEN 5-325 MG PO TABS: 1.0000 | ORAL_TABLET | ORAL | Status: AC | PRN

## 2011-07-21 MED ORDER — IBUPROFEN 600 MG PO TABS: 600.0000 mg | ORAL_TABLET | Freq: Four times a day (QID) | ORAL | Status: AC

## 2011-07-21 NOTE — Discharge Summary (Signed)
Obstetric Discharge Summary Reason for Admission: induction of labor Prenatal Procedures: none Intrapartum Procedures: spontaneous vaginal delivery Postpartum Procedures: none Complications-Operative and Postpartum: 1 degree perineal laceration Hemoglobin  Date Value Range Status  07/21/2011 10.6* 12.0-15.0 (g/dL) Final     HCT  Date Value Range Status  07/21/2011 31.0* 36.0-46.0 (%) Final    Discharge Diagnoses: Term Pregnancy-delivered  Discharge Information: Date: 07/21/2011 Activity: pelvic rest Diet: routine Medications: PNV, Ibuprophen, Percocet and norvasc Condition: stable Instructions: refer to practice specific booklet Discharge to: home   Newborn Data: Live born female  Birth Weight: 7 lb 4 oz (3289 g) APGAR: 8, 9  Home with mother.  Ladina Shutters G 07/21/2011, 8:38 AM

## 2011-07-21 NOTE — Progress Notes (Signed)
Post Partum Day 1 Subjective: no complaints, up ad lib, voiding and tolerating PO  Objective: Blood pressure 127/82, pulse 68, temperature 97.6 F (36.4 C), temperature source Oral, resp. rate 18, height 5\' 3"  (1.6 m), weight 90.719 kg (200 lb), SpO2 97.00%, unknown if currently breastfeeding.  Physical Exam:  General: alert and cooperative Lochia: appropriate Uterine Fundus: firm Perineum intact DVT Evaluation: No evidence of DVT seen on physical exam.   Basename 07/21/11 0540 07/20/11 0634  HGB 10.6* 10.8*  HCT 31.0* 32.1*    Assessment/Plan: Discharge home   LOS: 2 days   CURTIS,CAROL G 07/21/2011, 8:26 AM

## 2011-07-23 NOTE — Anesthesia Postprocedure Evaluation (Signed)
Anesthesia Post Note  Patient: Tamara Frazier  Procedure(s) Performed: * No procedures listed *  Anesthesia type: Epidural  Patient location: Mother/Baby  Post pain: Pain level controlled  Post assessment: Post-op Vital signs reviewed  Last Vitals: There were no vitals filed for this visit.  Post vital signs: Reviewed  Level of consciousness: awake  Complications: No apparent anesthesia complications

## 2011-07-28 ENCOUNTER — Inpatient Hospital Stay (HOSPITAL_COMMUNITY): Admission: RE | Admit: 2011-07-28 | Payer: BC Managed Care – PPO | Source: Ambulatory Visit

## 2011-08-18 LAB — CBC
HCT: 36.4
Hemoglobin: 11.8 — ABNORMAL LOW
MCV: 80.9
Platelets: 360
WBC: 8.2

## 2011-08-18 LAB — DIFFERENTIAL
Eosinophils Absolute: 0.2
Eosinophils Relative: 3
Lymphocytes Relative: 20
Lymphs Abs: 1.7
Monocytes Absolute: 0.6
Monocytes Relative: 8

## 2011-08-18 LAB — URINALYSIS, ROUTINE W REFLEX MICROSCOPIC
Bilirubin Urine: NEGATIVE
Glucose, UA: NEGATIVE
Hgb urine dipstick: NEGATIVE
Ketones, ur: NEGATIVE
Protein, ur: NEGATIVE
pH: 7

## 2011-08-18 LAB — POCT I-STAT, CHEM 8
BUN: 11
Calcium, Ion: 1.17
Chloride: 101
Glucose, Bld: 89
HCT: 39
Potassium: 3.7

## 2011-08-19 LAB — CBC
HCT: 35.8 — ABNORMAL LOW
MCHC: 33.7
MCV: 81.6
Platelets: 392
RBC: 4.39
WBC: 7.5

## 2011-08-19 LAB — DIFFERENTIAL
Basophils Relative: 0
Eosinophils Absolute: 0.2
Eosinophils Relative: 2
Lymphs Abs: 1.7
Monocytes Relative: 11

## 2011-08-19 LAB — BASIC METABOLIC PANEL
BUN: 8
CO2: 25
Chloride: 104
Creatinine, Ser: 0.76
Potassium: 3.4 — ABNORMAL LOW

## 2011-08-23 LAB — COMPREHENSIVE METABOLIC PANEL
ALT: 19
AST: 24
Albumin: 3.3 — ABNORMAL LOW
Calcium: 8.7
GFR calc Af Amer: >60
Sodium: 139
Total Protein: 6.5

## 2011-08-23 LAB — DIFFERENTIAL
Eosinophils Absolute: 0.2
Eosinophils Relative: 3
Lymphs Abs: 1.4
Monocytes Relative: 8

## 2011-08-23 LAB — RAPID URINE DRUG SCREEN, HOSP PERFORMED
Amphetamines: NOT DETECTED
Cocaine: NOT DETECTED
Opiates: NOT DETECTED
Tetrahydrocannabinol: NOT DETECTED

## 2011-08-23 LAB — CBC
MCHC: 33.2
RBC: 4.4
RDW: 16 — ABNORMAL HIGH

## 2011-08-23 LAB — GLUCOSE, CAPILLARY: Glucose-Capillary: 117 — ABNORMAL HIGH

## 2011-08-23 LAB — URINALYSIS, ROUTINE W REFLEX MICROSCOPIC
Glucose, UA: NEGATIVE
Hgb urine dipstick: NEGATIVE
Ketones, ur: NEGATIVE
Protein, ur: NEGATIVE
Urobilinogen, UA: 0.2

## 2011-08-23 LAB — URINE MICROSCOPIC-ADD ON

## 2011-09-03 LAB — TROPONIN I: Troponin I: 0.02

## 2011-09-03 LAB — I-STAT 8, (EC8 V) (CONVERTED LAB)
Acid-Base Excess: 3 — ABNORMAL HIGH
Chloride: 107
HCT: 33 — ABNORMAL LOW
Operator id: 285491
Potassium: 3.8
pCO2, Ven: 30.5 — ABNORMAL LOW

## 2011-09-03 LAB — POCT CARDIAC MARKERS
CKMB, poc: <1 — ABNORMAL LOW
Operator id: 285491
Operator id: 285491
Troponin i, poc: <0.05

## 2011-09-03 LAB — PREGNANCY, URINE: Preg Test, Ur: NEGATIVE

## 2011-09-03 LAB — LIPID PANEL
Cholesterol: 185
LDL Cholesterol: 124 — ABNORMAL HIGH

## 2011-09-03 LAB — COMPREHENSIVE METABOLIC PANEL
ALT: 14
AST: 19
Alkaline Phosphatase: 59
CO2: 30
GFR calc Af Amer: >60
GFR calc non Af Amer: >60
Glucose, Bld: 95
Potassium: 3.8
Sodium: 141
Total Protein: 5.6 — ABNORMAL LOW

## 2011-09-03 LAB — PROTIME-INR: Prothrombin Time: 13.1

## 2011-09-03 LAB — URINE DRUGS OF ABUSE SCREEN W ALC, ROUTINE (REF LAB)
Amphetamine Screen, Ur: NEGATIVE
Cocaine Metabolites: NEGATIVE
Creatinine,U: 124.6
Ethyl Alcohol: <5
Methadone: NEGATIVE
Opiate Screen, Urine: NEGATIVE

## 2011-09-03 LAB — CBC
Hemoglobin: 10.4 — ABNORMAL LOW
RBC: 3.61 — ABNORMAL LOW

## 2011-09-03 LAB — CARDIAC PANEL(CRET KIN+CKTOT+MB+TROPI)
CK, MB: 1
CK, MB: 1.3
Relative Index: INVALID
Total CK: 64
Total CK: 88

## 2011-09-03 LAB — D-DIMER, QUANTITATIVE (NOT AT ARMC): D-Dimer, Quant: <0.22

## 2011-09-03 LAB — CK TOTAL AND CKMB (NOT AT ARMC): Relative Index: INVALID

## 2011-09-03 LAB — POCT I-STAT CREATININE: Creatinine, Ser: 0.8

## 2012-07-31 DIAGNOSIS — I10 Essential (primary) hypertension: Secondary | ICD-10-CM

## 2012-07-31 DIAGNOSIS — E785 Hyperlipidemia, unspecified: Secondary | ICD-10-CM

## 2012-08-27 ENCOUNTER — Emergency Department (HOSPITAL_COMMUNITY): Payer: BC Managed Care – PPO

## 2012-08-27 ENCOUNTER — Emergency Department (HOSPITAL_COMMUNITY)
Admission: EM | Admit: 2012-08-27 | Discharge: 2012-08-27 | Disposition: A | Discharge status: Home / Self Care | Payer: BC Managed Care – PPO | Attending: Emergency Medicine | Admitting: Emergency Medicine

## 2012-08-27 ENCOUNTER — Encounter (HOSPITAL_COMMUNITY): Payer: Self-pay | Admitting: Emergency Medicine

## 2012-08-27 DIAGNOSIS — G43109 Migraine with aura, not intractable, without status migrainosus: Secondary | ICD-10-CM | POA: Insufficient documentation

## 2012-08-27 DIAGNOSIS — I10 Essential (primary) hypertension: Secondary | ICD-10-CM | POA: Insufficient documentation

## 2012-08-27 DIAGNOSIS — G43409 Hemiplegic migraine, not intractable, without status migrainosus: Secondary | ICD-10-CM

## 2012-08-27 HISTORY — DX: Migraine, unspecified, not intractable, without status migrainosus: G43.909

## 2012-08-27 LAB — BASIC METABOLIC PANEL
CO2: 25 mEq/L (ref 19–32)
Calcium: 8.8 mg/dL (ref 8.4–10.5)
Creatinine, Ser: 0.67 mg/dL (ref 0.50–1.10)
GFR calc non Af Amer: >90 mL/min (ref >90)
Glucose, Bld: 92 mg/dL (ref 70–99)
Sodium: 140 mEq/L (ref 135–145)

## 2012-08-27 LAB — CBC
MCH: 30.3 pg (ref 26.0–34.0)
MCHC: 34.5 g/dL (ref 30.0–36.0)
MCV: 87.9 fL (ref 78.0–100.0)
Platelets: 284 10*3/uL (ref 150–400)
RBC: 4.62 MIL/uL (ref 3.87–5.11)
RDW: 12.9 % (ref 11.5–15.5)

## 2012-08-27 MED ORDER — METOCLOPRAMIDE HCL 5 MG/ML IJ SOLN
10.0000 mg | Freq: Once | INTRAMUSCULAR | Status: AC
  Administered 2012-08-27: 10 mg via INTRAVENOUS
  Filled 2012-08-27: qty 2

## 2012-08-27 MED ORDER — DIPHENHYDRAMINE HCL 25 MG PO TABS: 25.0000 mg | ORAL_TABLET | Freq: Once | ORAL | Status: DC | PRN

## 2012-08-27 MED ORDER — SODIUM CHLORIDE 0.9 % IV SOLN
INTRAVENOUS | Status: DC
  Administered 2012-08-27: 18:00:00 via INTRAVENOUS

## 2012-08-27 MED ORDER — SODIUM CHLORIDE 0.9 % IV BOLUS (SEPSIS)
1000.0000 mL | Freq: Once | INTRAVENOUS | Status: AC
  Administered 2012-08-27: 1000 mL via INTRAVENOUS

## 2012-08-27 MED ORDER — PROCHLORPERAZINE MALEATE 10 MG PO TABS: 10.0000 mg | ORAL_TABLET | Freq: Once | ORAL | Status: DC | PRN

## 2012-08-27 MED ORDER — DEXAMETHASONE SODIUM PHOSPHATE 10 MG/ML IJ SOLN
10.0000 mg | Freq: Once | INTRAMUSCULAR | Status: AC
  Administered 2012-08-27: 10 mg via INTRAVENOUS
  Filled 2012-08-27: qty 1

## 2012-08-27 MED ORDER — DIPHENHYDRAMINE HCL 50 MG/ML IJ SOLN
25.0000 mg | Freq: Once | INTRAMUSCULAR | Status: AC
  Administered 2012-08-27: 25 mg via INTRAVENOUS
  Filled 2012-08-27: qty 1

## 2012-08-27 NOTE — ED Notes (Signed)
The pts headache is better.  She feel like her headache is a regular headache now

## 2012-08-27 NOTE — ED Notes (Signed)
Per RN first, pt's husband brought pt to front, and RN had to pull pt out of car; pt was noted to be aphasic, facial droop, and pale; brought to bridge and no code stroke called d/t LSN is out of window (started last night/this AM); pt brought to Ct scan and report received from RN first; pt c/o post migraine, with hx of migraines

## 2012-08-27 NOTE — Consult Note (Signed)
Reason for Consult:Numbness and difficulty speaking Referring Physician: Cheri Guppy  CC: Right sided numbness  History is obtained from:Patient, husband  HPI: Tamara Frazier is an 41 y.o. female who began having her typical migraine last night. It did not resolve by this morning, and then this afternoon when was still going on and getting worse she decided to come into the emergency room for treatment. On arrival to the [facility] cone she was walking in the bright sun and subsequent began to develop tingling at her shoulder which then spread down her arm and into her leg and face. She states that there was some numbness with it. She then also began having difficulty getting words out. She says that she knew what she wanted to say but couldn't get the words out. She also feels weak on the right side and had some spots in the right visual field which are now resolved.  By the time I was discussing things with her, her speech was rapidly improving and she stated that she felt almost back to her normal self prior to the conclusion.   ROS: An 11 point ROS was performed and is negative except as noted in the HPI.  Past Medical History  Diagnosis Date  . Hypertension   . Diverticulitis   . Blood transfusion   . Migraine     Family History: Mother-migraine  Social History: Tob: Denies  Exam: Current vital signs: BP 134/53  Pulse 82  Temp 98.8 F (37.1 C) (Oral)  Resp 20  SpO2 98%  LMP 08/27/2012  Breastfeeding? No Vital signs in last 24 hours: Temp:  [98.8 F (37.1 C)] 98.8 F (37.1 C) (10/06 1741) Pulse Rate:  [82] 82  (10/06 1741) Resp:  [20] 20  (10/06 1741) BP: (134)/(53) 134/53 mmHg (10/06 1741) SpO2:  [98 %] 98 % (10/06 1741)  General: In bed, lights are off CV: Regular rate and rhythm Mental Status: Patient is awake, alert, oriented to person, place, month, year, and situation. Immediate and remote memory are intact. Patient is able to give a clear and coherent  history. Initially her speech is very slow and deliberate, however it rapidly improves to the point that she is speaking normally. Cranial Nerves: II: Visual Fields are full. Pupils are equal, round, and reactive to light.  Discs are sharp. III,IV, VI: EOMI without ptosis or diploplia.  V,VII: Facial sensation and movement are symmetric.  VIII: hearing is intact to voice X: Uvula elevates symmetrically XI: Shoulder shrug is symmetric. XII: tongue is midline without atrophy or fasciculations.  Motor: Tone is normal. Bulk is normal. 5/5 strength was present in her left side. She has a mild downward drift without pronation in her right arm and has 4/5 strength to confrontation on that side. Her right leg also has 4/5 strength, though with encouragement she is able to improve her hip flexions strength to 4+/5. Sensory: Sensation is symmetric to temperature in the arms and legs. To light touch, she feels that he does not feel normal on her right side at all, but she is able to feel it. Deep Tendon Reflexes: 2+ and symmetric in the biceps and patellae.  Plantars: Toes are downgoing bilaterally.  Cerebellar: FNF intact on left, mild difficulty on right Gait: Sitting patient up, she becomes lightheaded and therefore gait was not assessed  I have reviewed labs in epic and the results pertinent to this consultation are: BMP-within normal limits, CBC-within normal limits  I have reviewed the images obtained: CT  head-negative  Impression: 41 year old female with history of migraines now with migrainous started as a typical migraine but has become worse. The story of spreading positive symptoms (tingling), coupled with the character of her headache and the resolving symptoms with migraine treatment give [place] the diagnosis of complicated migraine. As long as her symptoms resolve, as I expect they will given the rapidity with which she is getting better she does not need any further imaging. If she were  to have persistent numbness or weakness, and an MRI brain would be indicated to rule out migrainous stroke.  Recommendations: 1) if symptoms resolve, would discharge with Compazine 10 mg by mouth when necessary for headache, and would advise the patient to take this with Benadryl 25 mg by mouth 2) if weakness or numbness persists more than a couple hours, then perform an MRI to rule out migrainous stroke.  Ritta Slot, MD [facility] [phone]  If 7pm- 7am, please page neurology on call at [phone].

## 2012-08-27 NOTE — ED Provider Notes (Signed)
I saw and evaluated the patient, reviewed the resident's note and I agree with the findings and plan. 41 year old, female, with a history of migraines presents emergency department complaining of a headache, with nausea and vomiting.  Since last night.  She also states that she had seen.  Lites in her right visual field.  Last night.  This evening.  She passed out while she was in her truck.  She states that her headache is improved, but not gone.  She states that her headache is different than her typical migraine headaches.  She denies pain anywhere else.  She is alert, but has delayed responses to questions and seems slightly altered in terms of her mental status.  She has mild peripheral weakness.  We will perform a CAT scan of her head and treat her symptomatically.  We will consult neurology, for further recommendations.  Cheri Guppy, MD 08/27/12 1924

## 2012-08-27 NOTE — ED Provider Notes (Signed)
History     CSN: [id number]  Arrival date & time 08/27/12  1721   First MD Initiated Contact with Patient 08/27/12 1729      Chief Complaint  Patient presents with  . Stroke Symptoms    (Consider location/radiation/quality/duration/timing/severity/associated sxs/prior treatment) Patient is a 41 y.o. female presenting with migraines. The history is provided by the patient and the spouse.  Migraine This is a recurrent problem. The current episode started yesterday. The problem occurs constantly. The problem has been gradually worsening. Associated symptoms include anorexia, fatigue, headaches, vomiting and weakness (right side of body). Pertinent negatives include no abdominal pain, arthralgias, change in bowel habit, chest pain, coughing, fever or nausea. Exacerbated by: light-weakness did not start until patient went outside. She has tried nothing for the symptoms. The treatment provided no relief.    Past Medical History  Diagnosis Date  . Hypertension   . Diverticulitis   . Blood transfusion   . Migraine     Past Surgical History  Procedure Date  . Cesarean section     tubal reversal jan.2010  . Tubal ligation     History reviewed. No pertinent family history.  History  Substance Use Topics  . Smoking status: Never Smoker   . Smokeless tobacco: Not on file  . Alcohol Use: No    OB History    Grav Para Term Preterm Abortions TAB SAB Ect Mult Living   6 5 5       5       Review of Systems  Constitutional: Positive for fatigue. Negative for fever.  Eyes: Positive for photophobia.  Respiratory: Negative for cough and shortness of breath.   Cardiovascular: Negative for chest pain.  Gastrointestinal: Positive for vomiting and anorexia. Negative for nausea, abdominal pain, diarrhea and change in bowel habit.  Musculoskeletal: Negative for arthralgias.  Neurological: Positive for weakness (right side of body) and headaches.  All other systems reviewed and are  negative.    Allergies  Hydrocodone  Home Medications   Current Outpatient Rx  Name Route Sig Dispense Refill  . AMLODIPINE BESYLATE 10 MG PO TABS Oral Take 10 mg by mouth daily.      Marland Kitchen HYDROCHLOROTHIAZIDE 25 MG PO TABS Oral Take 25 mg by mouth daily.    . ADULT MULTIVITAMIN W/MINERALS CH Oral Take 1 tablet by mouth daily.      BP 137/74  Pulse 75  Temp 98 F (36.7 C) (Oral)  Resp 12  SpO2 99%  LMP 08/27/2012  Breastfeeding? No  Physical Exam  Nursing note and vitals reviewed. Constitutional: She is oriented to person, place, and time. She appears well-developed and well-nourished. No distress.  HENT:  Head: Normocephalic and atraumatic.  Eyes: EOM are normal. Pupils are equal, round, and reactive to light.  Neck: Normal range of motion.  Cardiovascular: Normal rate and normal heart sounds.   Pulmonary/Chest: Effort normal and breath sounds normal. No respiratory distress.  Abdominal: Soft. She exhibits no distension. There is no tenderness.  Musculoskeletal: Normal range of motion.  Neurological: She is alert and oriented to person, place, and time. A cranial nerve deficit is present. She exhibits abnormal muscle tone. Coordination normal.       Pt with trouble talking, right side weakness and left facial droop  Skin: Skin is warm and dry.    ED Course  Procedures (including critical care time)   Labs Reviewed  BASIC METABOLIC PANEL  CBC   Ct Head Wo Contrast  08/27/2012  *RADIOLOGY  REPORT*  Clinical Data: History of migraines, concern for possible subarachnoid hemorrhage, blurred vision right eye, dizziness, syncopal episode.  CT HEAD WITHOUT CONTRAST  Technique:  Contiguous axial images were obtained from the base of the skull through the vertex without contrast.  Comparison: 08/06/2008  Findings:  Wallace Cullens white differentiation is maintained.  No CT evidence of acute large territory infarct.  No intraparenchymal or extra-axial mass or hemorrhage.  Normal size and  configuration of the ventricles and basilar cisterns.  No midline shift.  The paranasal sinuses and mastoid air cells are normal.  Regional soft tissues are normal. No definite displaced calvarial fracture.  IMPRESSION: Negative noncontrast head CT.  Above findings discussed with Dr. Weldon Inches at [phone].   Original Report Authenticated By: Waynard Reeds, M.D.      1. Complicated migraine       MDM  Pt presents with bad migraine and new onset right side weakness. Head CT without any acute changes. Feel that this could be due to migraine, so will give migraine cocktail. Will also consult neurology. Patient is outside tPA window, so do not feel that activating code stroke would be useful.  Neurology thinks this is likely a complicated migraine as symptoms are already starting to improve. Will continue to observe.   7:11 PM Pt with almost complete resolution of right side weakness. She is now talking normally. She still has a minor headache, but this has improved from previous as well.   9:17 PM Pt has had complete resolution of her weakness. Will discharge.   Daleen Bo, MD 08/28/12 [phone]

## 2012-08-27 NOTE — ED Notes (Signed)
The pt reports that she is ready to go home

## 2012-08-28 DIAGNOSIS — G43909 Migraine, unspecified, not intractable, without status migrainosus: Secondary | ICD-10-CM | POA: Insufficient documentation

## 2012-08-28 NOTE — ED Provider Notes (Signed)
I saw and evaluated the patient, reviewed the resident's note and I agree with the findings and plan.  Norah Fick, MD 08/28/12 0715 

## 2014-04-28 ENCOUNTER — Emergency Department (HOSPITAL_COMMUNITY)
Admission: EM | Admit: 2014-04-28 | Discharge: 2014-04-29 | Disposition: A | Discharge status: Home / Self Care | Payer: BC Managed Care – PPO | Attending: Emergency Medicine | Admitting: Emergency Medicine

## 2014-04-28 ENCOUNTER — Encounter (HOSPITAL_COMMUNITY): Payer: Self-pay | Admitting: Emergency Medicine

## 2014-04-28 ENCOUNTER — Emergency Department (HOSPITAL_COMMUNITY): Payer: BC Managed Care – PPO

## 2014-04-28 DIAGNOSIS — Z79899 Other long term (current) drug therapy: Secondary | ICD-10-CM | POA: Insufficient documentation

## 2014-04-28 DIAGNOSIS — R51 Headache: Secondary | ICD-10-CM | POA: Insufficient documentation

## 2014-04-28 DIAGNOSIS — I10 Essential (primary) hypertension: Secondary | ICD-10-CM | POA: Insufficient documentation

## 2014-04-28 DIAGNOSIS — J159 Unspecified bacterial pneumonia: Secondary | ICD-10-CM | POA: Insufficient documentation

## 2014-04-28 DIAGNOSIS — R072 Precordial pain: Secondary | ICD-10-CM | POA: Insufficient documentation

## 2014-04-28 DIAGNOSIS — R079 Chest pain, unspecified: Secondary | ICD-10-CM

## 2014-04-28 DIAGNOSIS — Z8719 Personal history of other diseases of the digestive system: Secondary | ICD-10-CM | POA: Insufficient documentation

## 2014-04-28 DIAGNOSIS — M7989 Other specified soft tissue disorders: Secondary | ICD-10-CM | POA: Insufficient documentation

## 2014-04-28 DIAGNOSIS — J189 Pneumonia, unspecified organism: Secondary | ICD-10-CM

## 2014-04-28 LAB — CBC
HCT: 36.9 % (ref 36.0–46.0)
HEMOGLOBIN: 12.4 g/dL (ref 12.0–15.0)
MCH: 29.6 pg (ref 26.0–34.0)
MCHC: 33.6 g/dL (ref 30.0–36.0)
MCV: 88.1 fL (ref 78.0–100.0)
Platelets: 324 10*3/uL (ref 150–400)
RBC: 4.19 MIL/uL (ref 3.87–5.11)
RDW: 13.2 % (ref 11.5–15.5)
WBC: 9.6 10*3/uL (ref 4.0–10.5)

## 2014-04-28 LAB — BASIC METABOLIC PANEL
BUN: 14 mg/dL (ref 6–23)
CALCIUM: 9.3 mg/dL (ref 8.4–10.5)
CO2: 27 meq/L (ref 19–32)
CREATININE: 0.72 mg/dL (ref 0.50–1.10)
Chloride: 102 mEq/L (ref 96–112)
GFR calc Af Amer: >90 mL/min (ref >90)
GFR calc non Af Amer: >90 mL/min (ref >90)
GLUCOSE: 90 mg/dL (ref 70–99)
Potassium: 3.6 mEq/L — ABNORMAL LOW (ref 3.7–5.3)
Sodium: 140 mEq/L (ref 137–147)

## 2014-04-28 LAB — I-STAT TROPONIN, ED: Troponin i, poc: 0 ng/mL (ref 0.00–0.08)

## 2014-04-28 MED ORDER — ASPIRIN 81 MG PO CHEW
324.0000 mg | CHEWABLE_TABLET | Freq: Once | ORAL | Status: AC
  Administered 2014-04-28: 324 mg via ORAL
  Filled 2014-04-28: qty 4

## 2014-04-28 MED ORDER — CEPHALEXIN 500 MG PO CAPS: 1000.0000 mg | ORAL_CAPSULE | Freq: Two times a day (BID) | ORAL | Status: AC

## 2014-04-28 MED ORDER — AZITHROMYCIN 250 MG PO TABS: 250.0000 mg | ORAL_TABLET | Freq: Every day | ORAL | Status: DC

## 2014-04-28 NOTE — ED Provider Notes (Signed)
I saw and evaluated the patient, reviewed the resident's note and I agree with the findings and plan.   EKG Interpretation   Date/Time:  Sunday April 28 2014 20:33:40 EDT Ventricular Rate:  73 PR Interval:  156 QRS Duration: 78 QT Interval:  382 QTC Calculation: 420 R Axis:   12 Text Interpretation:  Normal sinus rhythm Anteroseptal infarct , age  undetermined No significant change since last tracing Confirmed by [facility]   MD, Jonny Ruiz ([id number]) on 04/28/2014 10:45:19 PM     HEART Score 1 Low test Wells PE and PERC negative Questionable PNA on CXR with constant vague CP with fatigue 24 hours but no fever/cough; Pt agrees antibiotic reasonable with PCP f/u this week; Lungs CTA bilat.  Hurman Horn, MD 04/29/14 [phone]

## 2014-04-28 NOTE — ED Provider Notes (Signed)
CSN: [id number]     Arrival date & time 04/28/14  2013 History   First MD Initiated Contact with Patient 04/28/14 2210     Chief Complaint  Patient presents with  . Chest Pain     (Consider location/radiation/quality/duration/timing/severity/associated sxs/prior Treatment) Patient is a 43 y.o. female presenting with chest pain. The history is provided by the patient and the spouse.  Chest Pain Pain location:  Substernal area Pain quality comment:  Heavy Pain radiates to:  Does not radiate Pain severity:  Moderate Onset quality:  Gradual Timing:  Constant Progression:  Unchanged Chronicity: has had in the past, but this persisting more than prior. Relieved by:  Rest Worsened by:  Movement Ineffective treatments:  None tried Associated symptoms: headache   Associated symptoms: no abdominal pain, no cough, no dizziness, no fever, no nausea, no shortness of breath and not vomiting     43 yo f pw chest heaviness. Onset last night. Constant.  Improved with rest. Worsened with movement. No radiation. No n/v. Mild headache credited to stress. No SOB. Although states it sometimes makes heaviness feel better when she takes a deep breath. No trauma. No past cardiac history. Has Hypertension. On antihypertensives. No cough or fever. Chronic ble leg swelling. Not new and not worsening. No leg pain. No recent immobilization. No hemoptysis. No h/o DVT/PE.    Past Medical History  Diagnosis Date  . Hypertension   . Diverticulitis   . Blood transfusion   . Migraine    Past Surgical History  Procedure Laterality Date  . Cesarean section      tubal reversal jan.2010  . Tubal ligation    . Tubal ligation reversal     No family history on file. History  Substance Use Topics  . Smoking status: Never Smoker   . Smokeless tobacco: Not on file  . Alcohol Use: No   OB History   Grav Para Term Preterm Abortions TAB SAB Ect Mult Living   6 5 5       5      Review of Systems   Constitutional: Negative for fever and chills.  HENT: Negative for congestion and rhinorrhea.   Respiratory: Negative for cough and shortness of breath.   Cardiovascular: Positive for chest pain and leg swelling.  Gastrointestinal: Negative for nausea, vomiting and abdominal pain.  Genitourinary: Negative for dysuria, hematuria, flank pain and difficulty urinating.  Neurological: Positive for headaches. Negative for dizziness.  All other systems reviewed and are negative.     Allergies  Hydrocodone  Home Medications   Prior to Admission medications   Medication Sig Start Date End Date Taking? Authorizing Provider  amLODipine (NORVASC) 10 MG tablet Take 10 mg by mouth daily.     Yes Historical Provider, MD  Coenzyme Q10 (CO Q 10) 10 MG CAPS Take 2 tablets by mouth daily.   Yes Historical Provider, MD  hydrochlorothiazide (HYDRODIURIL) 25 MG tablet Take 25 mg by mouth daily.   Yes Historical Provider, MD  ibuprofen (ADVIL,MOTRIN) 200 MG tablet Take 400 mg by mouth every 6 (six) hours as needed.   Yes Historical Provider, MD  Multiple Vitamin (MULTIVITAMIN WITH MINERALS) TABS Take 1 tablet by mouth daily.   Yes Historical Provider, MD  azithromycin (ZITHROMAX) 250 MG tablet Take 1 tablet (250 mg total) by mouth daily. Take first 2 tablets together, then 1 every day until finished. 04/28/14   Stevie Kern, MD  cephALEXin (KEFLEX) 500 MG capsule Take 2 capsules (1,000 mg total) by  mouth 2 (two) times daily. 04/28/14 05/05/14  Stevie Kernyan Lesleyanne Politte, MD   BP 138/71  Pulse 87  Temp(Src) 97.8 F (36.6 C) (Oral)  Resp 15  Ht 5\' 3"  (1.6 m)  Wt 172 lb (78.019 kg)  BMI 30.48 kg/m2  SpO2 100%  LMP 04/14/2014 Physical Exam  Nursing note and vitals reviewed. Constitutional: She is oriented to person, place, and time. She appears well-developed and well-nourished. No distress.  HENT:  Head: Normocephalic and atraumatic.  Eyes: Conjunctivae are normal. Right eye exhibits no discharge. Left eye exhibits  no discharge.  Cardiovascular: Normal rate, regular rhythm, normal heart sounds and intact distal pulses.   Pulmonary/Chest: Effort normal and breath sounds normal. No respiratory distress. She has no wheezes. She has no rales. She exhibits no tenderness.  Abdominal: Soft. She exhibits no distension. There is no tenderness. There is no guarding.  Musculoskeletal: She exhibits no edema and no tenderness.  Neurological: She is alert and oriented to person, place, and time.  Skin: Skin is warm and dry.  Psychiatric: She has a normal mood and affect. Her behavior is normal.    ED Course  Procedures (including critical care time) Labs Review Labs Reviewed  BASIC METABOLIC PANEL - Abnormal; Notable for the following:    Potassium 3.6 (*)    All other components within normal limits  CBC  I-STAT TROPOININ, ED    Imaging Review Dg Chest 2 View  04/28/2014   CLINICAL DATA:  Chest heaviness. Palpitations. Denies shortness of breath.  EXAM: CHEST  2 VIEW  COMPARISON:  07/12/2007  FINDINGS: Normal cardiac silhouette. Slight right basilar opacity not clearly present previously could represent early infiltrate. No effusion or pneumothorax. Bones unremarkable.  IMPRESSION: Slight right basilar opacity not clearly present previously. Pneumonia not excluded.   Electronically Signed   By: Davonna BellingJohn  Curnes M.D.   On: 04/28/2014 22:52     EKG Interpretation   Date/Time:  Sunday April 28 2014 20:33:40 EDT Ventricular Rate:  73 PR Interval:  156 QRS Duration: 78 QT Interval:  382 QTC Calculation: 420 R Axis:   12 Text Interpretation:  Normal sinus rhythm Anteroseptal infarct , age  undetermined No significant change since last tracing Confirmed by [facility]NAR   MD, Jonny RuizJOHN ([id number]) on 04/28/2014 10:45:19 PM      MDM   Final diagnoses:  CAP (community acquired pneumonia)  Chest pain    Chest pain EKG similar to prior. No new ischemic changes. Troponin neg. Pain constant Low risk per [place]. PERC negative.  Doubt PE CXR concerning for pna. Treat with azithromycin and keflex.   Patient discharged home. Return precautions given. To follow up with pcp. Patient and spouse in agreement with plan.  Labs and imaging reviewed by myself and considered in medical decision making if ordered. Imaging interpreted by radiology.   Discussed case with Dr. Fonnie JarvisBednar who is in agreement with assessment and plan.     Stevie Kernyan Staria Birkhead, MD 04/29/14 [phone]

## 2014-04-28 NOTE — ED Notes (Signed)
Pt. reports mid chest " heaviness" onset last night and palpitations this morning , denies SOB / no nausea or diaphoresis .

## 2014-04-28 NOTE — Discharge Instructions (Signed)
Chest Pain (Nonspecific) °It is often hard to give a specific diagnosis for the cause of chest pain. There is always a chance that your pain could be related to something serious, such as a heart attack or a blood clot in the lungs. You need to follow up with your caregiver for further evaluation. °CAUSES  °· Heartburn. °· Pneumonia or bronchitis. °· Anxiety or stress. °· Inflammation around your heart (pericarditis) or lung (pleuritis or pleurisy). °· A blood clot in the lung. °· A collapsed lung (pneumothorax). It can develop suddenly on its own (spontaneous pneumothorax) or from injury (trauma) to the chest. °· Shingles infection (herpes zoster virus). °The chest wall is composed of bones, muscles, and cartilage. Any of these can be the source of the pain. °· The bones can be bruised by injury. °· The muscles or cartilage can be strained by coughing or overwork. °· The cartilage can be affected by inflammation and become sore (costochondritis). °DIAGNOSIS  °Lab tests or other studies, such as X-rays, electrocardiography, stress testing, or cardiac imaging, may be needed to find the cause of your pain.  °TREATMENT  °· Treatment depends on what may be causing your chest pain. Treatment may include: °· Acid blockers for heartburn. °· Anti-inflammatory medicine. °· Pain medicine for inflammatory conditions. °· Antibiotics if an infection is present. °· You may be advised to change lifestyle habits. This includes stopping smoking and avoiding alcohol, caffeine, and chocolate. °· You may be advised to keep your head raised (elevated) when sleeping. This reduces the chance of acid going backward from your stomach into your esophagus. °· Most of the time, nonspecific chest pain will improve within 2 to 3 days with rest and mild pain medicine. °HOME CARE INSTRUCTIONS  °· If antibiotics were prescribed, take your antibiotics as directed. Finish them even if you start to feel better. °· For the next few days, avoid physical  activities that bring on chest pain. Continue physical activities as directed. °· Do not smoke. °· Avoid drinking alcohol. °· Only take over-the-counter or prescription medicine for pain, discomfort, or fever as directed by your caregiver. °· Follow your caregiver's suggestions for further testing if your chest pain does not go away. °· Keep any follow-up appointments you made. If you do not go to an appointment, you could develop lasting (chronic) problems with pain. If there is any problem keeping an appointment, you must call to reschedule. °SEEK MEDICAL CARE IF:  °· You think you are having problems from the medicine you are taking. Read your medicine instructions carefully. °· Your chest pain does not go away, even after treatment. °· You develop a rash with blisters on your chest. °SEEK IMMEDIATE MEDICAL CARE IF:  °· You have increased chest pain or pain that spreads to your arm, neck, jaw, back, or abdomen. °· You develop shortness of breath, an increasing cough, or you are coughing up blood. °· You have severe back or abdominal pain, feel nauseous, or vomit. °· You develop severe weakness, fainting, or chills. °· You have a fever. °THIS IS AN EMERGENCY. Do not wait to see if the pain will go away. Get medical help at once. Call your local emergency services (911 in [place] Do not drive yourself to the hospital. °MAKE SURE YOU:  °· Understand these instructions. °· Will watch your condition. °· Will get help right away if you are not doing well or get worse. °Document Released: 08/18/2005 Document Revised: 01/31/2012 Document Reviewed: 06/13/2008 °ExitCare® Patient Information ©2014 [employer]   [employer]. ° °Pneumonia, Adult °Pneumonia is an infection of the lungs.  °CAUSES °Pneumonia may be caused by bacteria or a virus. Usually, these infections are caused by breathing infectious particles into the lungs (respiratory tract). °SYMPTOMS  °· Cough. °· Fever. °· Chest pain. °· Increased rate of  breathing. °· Wheezing. °· Mucus production. °DIAGNOSIS  °If you have the common symptoms of pneumonia, your caregiver will typically confirm the diagnosis with a chest X-ray. The X-ray will show an abnormality in the lung (pulmonary infiltrate) if you have pneumonia. Other tests of your blood, urine, or sputum may be done to find the specific cause of your pneumonia. Your caregiver may also do tests (blood gases or pulse oximetry) to see how well your lungs are working. °TREATMENT  °Some forms of pneumonia may be spread to other people when you cough or sneeze. You may be asked to wear a mask before and during your exam. Pneumonia that is caused by bacteria is treated with antibiotic medicine. Pneumonia that is caused by the influenza virus may be treated with an antiviral medicine. Most other viral infections must run their course. These infections will not respond to antibiotics.  °PREVENTION °A pneumococcal shot (vaccine) is available to prevent a common bacterial cause of pneumonia. This is usually suggested for: °· People over 65 years old. °· Patients on chemotherapy. °· People with chronic lung problems, such as bronchitis or emphysema. °· People with immune system problems. °If you are over 65 or have a high risk condition, you may receive the pneumococcal vaccine if you have not received it before. In some countries, a routine influenza vaccine is also recommended. This vaccine can help prevent some cases of pneumonia. You may be offered the influenza vaccine as part of your care. °If you smoke, it is time to quit. You may receive instructions on how to stop smoking. Your caregiver can provide medicines and counseling to help you quit. °HOME CARE INSTRUCTIONS  °· Cough suppressants may be used if you are losing too much rest. However, coughing protects you by clearing your lungs. You should avoid using cough suppressants if you can. °· Your caregiver may have prescribed medicine if he or she thinks your  pneumonia is caused by a bacteria or influenza. Finish your medicine even if you start to feel better. °· Your caregiver may also prescribe an expectorant. This loosens the mucus to be coughed up. °· Only take over-the-counter or prescription medicines for pain, discomfort, or fever as directed by your caregiver. °· Do not smoke. Smoking is a common cause of bronchitis and can contribute to pneumonia. If you are a smoker and continue to smoke, your cough may last several weeks after your pneumonia has cleared. °· A cold steam vaporizer or humidifier in your room or home may help loosen mucus. °· Coughing is often worse at night. Sleeping in a semi-upright position in a recliner or using a couple pillows under your head will help with this. °· Get rest as you feel it is needed. Your body will usually let you know when you need to rest. °SEEK IMMEDIATE MEDICAL CARE IF:  °· Your illness becomes worse. This is especially true if you are elderly or weakened from any other disease. °· You cannot control your cough with suppressants and are losing sleep. °· You begin coughing up blood. °· You develop pain which is getting worse or is uncontrolled with medicines. °· You have a fever. °· Any of the symptoms which initially brought you   in for treatment are getting worse rather than better. °· You develop shortness of breath or chest pain. °MAKE SURE YOU:  °· Understand these instructions. °· Will watch your condition. °· Will get help right away if you are not doing well or get worse. °Document Released: 11/08/2005 Document Revised: 01/31/2012 Document Reviewed: 01/28/2011 °ExitCare® Patient Information ©2014 [employer]. ° °

## 2014-04-28 NOTE — ED Notes (Signed)
Patient transported to X-ray 

## 2014-04-28 NOTE — ED Notes (Signed)
MD at bedside. 

## 2014-04-28 NOTE — ED Notes (Signed)
Pt. returned from XR. 

## 2014-06-25 ENCOUNTER — Other Ambulatory Visit: Payer: Self-pay | Admitting: Physician Assistant

## 2014-06-25 ENCOUNTER — Other Ambulatory Visit: Payer: Self-pay | Admitting: *Deleted

## 2014-06-25 DIAGNOSIS — Z1231 Encounter for screening mammogram for malignant neoplasm of breast: Secondary | ICD-10-CM

## 2014-07-08 ENCOUNTER — Encounter (INDEPENDENT_AMBULATORY_CARE_PROVIDER_SITE_OTHER): Payer: Self-pay

## 2014-07-08 ENCOUNTER — Ambulatory Visit
Admission: RE | Admit: 2014-07-08 | Discharge: 2014-07-08 | Disposition: A | Discharge status: Home / Self Care | Payer: BC Managed Care – PPO | Source: Ambulatory Visit | Attending: Physician Assistant | Admitting: Physician Assistant

## 2014-07-08 DIAGNOSIS — Z1231 Encounter for screening mammogram for malignant neoplasm of breast: Secondary | ICD-10-CM

## 2014-09-23 ENCOUNTER — Encounter (HOSPITAL_COMMUNITY): Payer: Self-pay | Admitting: Emergency Medicine

## 2014-11-05 ENCOUNTER — Observation Stay (HOSPITAL_COMMUNITY)
Admission: EM | Admit: 2014-11-05 | Discharge: 2014-11-06 | Disposition: A | Discharge status: Home / Self Care | Payer: BC Managed Care – PPO | Attending: Internal Medicine | Admitting: Internal Medicine

## 2014-11-05 ENCOUNTER — Encounter (HOSPITAL_COMMUNITY): Payer: Self-pay

## 2014-11-05 ENCOUNTER — Emergency Department (HOSPITAL_COMMUNITY): Payer: BC Managed Care – PPO

## 2014-11-05 DIAGNOSIS — E785 Hyperlipidemia, unspecified: Secondary | ICD-10-CM | POA: Diagnosis not present

## 2014-11-05 DIAGNOSIS — K5792 Diverticulitis of intestine, part unspecified, without perforation or abscess without bleeding: Secondary | ICD-10-CM | POA: Diagnosis not present

## 2014-11-05 DIAGNOSIS — Z23 Encounter for immunization: Secondary | ICD-10-CM | POA: Insufficient documentation

## 2014-11-05 DIAGNOSIS — Z79899 Other long term (current) drug therapy: Secondary | ICD-10-CM | POA: Insufficient documentation

## 2014-11-05 DIAGNOSIS — E669 Obesity, unspecified: Secondary | ICD-10-CM | POA: Insufficient documentation

## 2014-11-05 DIAGNOSIS — Z792 Long term (current) use of antibiotics: Secondary | ICD-10-CM | POA: Diagnosis not present

## 2014-11-05 DIAGNOSIS — I10 Essential (primary) hypertension: Secondary | ICD-10-CM | POA: Insufficient documentation

## 2014-11-05 DIAGNOSIS — R079 Chest pain, unspecified: Secondary | ICD-10-CM | POA: Diagnosis present

## 2014-11-05 DIAGNOSIS — R3915 Urgency of urination: Secondary | ICD-10-CM | POA: Insufficient documentation

## 2014-11-05 DIAGNOSIS — R0789 Other chest pain: Secondary | ICD-10-CM

## 2014-11-05 DIAGNOSIS — G43909 Migraine, unspecified, not intractable, without status migrainosus: Secondary | ICD-10-CM | POA: Insufficient documentation

## 2014-11-05 DIAGNOSIS — Z8619 Personal history of other infectious and parasitic diseases: Secondary | ICD-10-CM | POA: Insufficient documentation

## 2014-11-05 HISTORY — DX: Streptococcal infection, unspecified site: A49.1

## 2014-11-05 HISTORY — DX: Hyperlipidemia, unspecified: E78.5

## 2014-11-05 LAB — URINALYSIS, ROUTINE W REFLEX MICROSCOPIC
Bilirubin Urine: NEGATIVE
Glucose, UA: NEGATIVE mg/dL
Hgb urine dipstick: NEGATIVE
KETONES UR: NEGATIVE mg/dL
LEUKOCYTES UA: NEGATIVE
Nitrite: NEGATIVE
PROTEIN: NEGATIVE mg/dL
Specific Gravity, Urine: 1.024 (ref 1.005–1.030)
Urobilinogen, UA: 1 mg/dL (ref 0.0–1.0)
pH: 5.5 (ref 5.0–8.0)

## 2014-11-05 LAB — COMPREHENSIVE METABOLIC PANEL
ALK PHOS: 102 U/L (ref 39–117)
ALT: 17 U/L (ref 0–35)
ANION GAP: 11 (ref 5–15)
AST: 19 U/L (ref 0–37)
Albumin: 3.4 g/dL — ABNORMAL LOW (ref 3.5–5.2)
BUN: 15 mg/dL (ref 6–23)
CO2: 29 mEq/L (ref 19–32)
Calcium: 9.2 mg/dL (ref 8.4–10.5)
Chloride: 100 mEq/L (ref 96–112)
Creatinine, Ser: 0.73 mg/dL (ref 0.50–1.10)
GFR calc Af Amer: >90 mL/min (ref >90)
GFR calc non Af Amer: >90 mL/min (ref >90)
Glucose, Bld: 87 mg/dL (ref 70–99)
POTASSIUM: 3.5 meq/L — AB (ref 3.7–5.3)
Sodium: 140 mEq/L (ref 137–147)
Total Protein: 6.9 g/dL (ref 6.0–8.3)

## 2014-11-05 LAB — CBC WITH DIFFERENTIAL/PLATELET
BASOS ABS: 0 10*3/uL (ref 0.0–0.1)
Basophils Relative: 0 % (ref 0–1)
Eosinophils Absolute: 0.3 10*3/uL (ref 0.0–0.7)
Eosinophils Relative: 3 % (ref 0–5)
HCT: 40.1 % (ref 36.0–46.0)
Hemoglobin: 13.6 g/dL (ref 12.0–15.0)
Lymphocytes Relative: 22 % (ref 12–46)
Lymphs Abs: 2 10*3/uL (ref 0.7–4.0)
MCH: 29.6 pg (ref 26.0–34.0)
MCHC: 33.9 g/dL (ref 30.0–36.0)
MCV: 87.2 fL (ref 78.0–100.0)
Monocytes Absolute: 0.7 10*3/uL (ref 0.1–1.0)
Monocytes Relative: 8 % (ref 3–12)
NEUTROS ABS: 5.9 10*3/uL (ref 1.7–7.7)
NEUTROS PCT: 67 % (ref 43–77)
PLATELETS: 284 10*3/uL (ref 150–400)
RBC: 4.6 MIL/uL (ref 3.87–5.11)
RDW: 13 % (ref 11.5–15.5)
WBC: 8.8 10*3/uL (ref 4.0–10.5)

## 2014-11-05 LAB — PREGNANCY, URINE: Preg Test, Ur: NEGATIVE

## 2014-11-05 LAB — I-STAT TROPONIN, ED: Troponin i, poc: 0 ng/mL (ref 0.00–0.08)

## 2014-11-05 MED ORDER — GI COCKTAIL ~~LOC~~: 30.0000 mL | Freq: Four times a day (QID) | ORAL | Status: DC | PRN

## 2014-11-05 MED ORDER — ACETAMINOPHEN 325 MG PO TABS: 650.0000 mg | ORAL_TABLET | ORAL | Status: DC | PRN

## 2014-11-05 MED ORDER — AMLODIPINE BESYLATE 10 MG PO TABS
10.0000 mg | ORAL_TABLET | Freq: Every day | ORAL | Status: DC
  Administered 2014-11-06: 10 mg via ORAL
  Filled 2014-11-05: qty 1

## 2014-11-05 MED ORDER — ZOLPIDEM TARTRATE 5 MG PO TABS: 5.0000 mg | ORAL_TABLET | Freq: Every evening | ORAL | Status: DC | PRN

## 2014-11-05 MED ORDER — ASPIRIN EC 325 MG PO TBEC
325.0000 mg | DELAYED_RELEASE_TABLET | Freq: Every day | ORAL | Status: DC
  Administered 2014-11-06: 325 mg via ORAL
  Filled 2014-11-05: qty 1

## 2014-11-05 MED ORDER — HYDROCHLOROTHIAZIDE 25 MG PO TABS
25.0000 mg | ORAL_TABLET | Freq: Every day | ORAL | Status: DC
  Administered 2014-11-06: 25 mg via ORAL
  Filled 2014-11-05: qty 1

## 2014-11-05 MED ORDER — POTASSIUM CHLORIDE CRYS ER 20 MEQ PO TBCR
40.0000 meq | EXTENDED_RELEASE_TABLET | Freq: Once | ORAL | Status: AC
  Administered 2014-11-05: 40 meq via ORAL
  Filled 2014-11-05: qty 2

## 2014-11-05 MED ORDER — SODIUM CHLORIDE 0.9 % IV SOLN
INTRAVENOUS | Status: DC
  Administered 2014-11-05: via INTRAVENOUS

## 2014-11-05 MED ORDER — HEPARIN SODIUM (PORCINE) 5000 UNIT/ML IJ SOLN: 5000.0000 [IU] | Freq: Three times a day (TID) | INTRAMUSCULAR | Status: DC

## 2014-11-05 MED ORDER — MORPHINE SULFATE 2 MG/ML IJ SOLN: 2.0000 mg | INTRAMUSCULAR | Status: DC | PRN

## 2014-11-05 MED ORDER — INFLUENZA VAC SPLIT QUAD 0.5 ML IM SUSY
0.5000 mL | PREFILLED_SYRINGE | INTRAMUSCULAR | Status: AC
  Administered 2014-11-06: 0.5 mL via INTRAMUSCULAR
  Filled 2014-11-05: qty 0.5

## 2014-11-05 MED ORDER — NITROGLYCERIN 0.4 MG SL SUBL: 0.4000 mg | SUBLINGUAL_TABLET | SUBLINGUAL | Status: DC | PRN

## 2014-11-05 MED ORDER — ONDANSETRON HCL 4 MG/2ML IJ SOLN: 4.0000 mg | Freq: Four times a day (QID) | INTRAMUSCULAR | Status: DC | PRN

## 2014-11-05 NOTE — H&P (Signed)
[facility] History and Physical  Tamara RosenthalVanessa A Frazier ZOX:[id number] DOB: 03-25-1971 DOA: 11/05/2014  Referring physician: Trixie DredgeEmily West PA PCP: Tamara IraniSPENCER,Tamara C, PA-C   Chief Complaint: Chest Pain and Pressure  HPI: Tamara Frazier is a 43 y.o. female presents with chest pain and pressure. Patient states that she has been having a lot of stress at work. She states that she was noting that her symptoms were getting worse over the last 4-5 days. She states that she had noted increased BP readings. She went to her PCP and was noted to have elevated BP. She also had an ECG done and it was told to her that there were some changes. She states that she has had palpitations and her has had her heart flipping in her chest. She states no shortness of breath was noted. She states she has no edema. She does not smoke. She states that she does have high cholesterol but is not on medications at this time.   Review of Systems:  Constitutional:  No weight loss, night sweats, Fevers, chills, fatigue.  HEENT:  No headaches, Difficulty swallowing,Tooth/dental problems,Sore throat Cardio-vascular:  No chest pain, Orthopnea, PND, swelling in lower extremities, anasarca, dizziness, ++palpitations  GI:  No heartburn, indigestion, abdominal pain, nausea, vomiting, diarrhea Resp:  No shortness of breath with exertion or at rest. No excess mucus, no productive cough, No non-productive cough, No coughing up of blood.No change in color of mucus.No wheezing Skin:  no rash or lesions GU:  no dysuria, change in color of urine, no urgency or frequency Musculoskeletal:  No joint pain or swelling. No decreased range of motion Psych:  No change in mood or affect. No depression or anxiety. No memory loss.   Past Medical History  Diagnosis Date  . Hypertension   . Diverticulitis   . Blood transfusion   . Migraine   . Hyperlipemia   . Pneumococcal infection     pt states she has had it 2 times    Past Surgical  History  Procedure Laterality Date  . Cesarean section      tubal reversal jan.2010  . Tubal ligation    . Tubal ligation reversal     Social History:  reports that she has never smoked. She does not have any smokeless tobacco history on file. She reports that she does not drink alcohol or use illicit drugs.  Allergies  Allergen Reactions  . Hydrocodone Nausea And Vomiting    No family history on file.   Prior to Admission medications   Medication Sig Start Date End Date Taking? Authorizing Provider  amLODipine (NORVASC) 10 MG tablet Take 10 mg by mouth daily.      Historical Provider, MD  azithromycin (ZITHROMAX) 250 MG tablet Take 1 tablet (250 mg total) by mouth daily. Take first 2 tablets together, then 1 every day until finished. Patient not taking: Reported on 11/05/2014 04/28/14   Stevie Kernyan McLennan, MD  Coenzyme Q10 (CO Q 10) 10 MG CAPS Take 2 tablets by mouth daily.    Historical Provider, MD  hydrochlorothiazide (HYDRODIURIL) 25 MG tablet Take 25 mg by mouth daily.    Historical Provider, MD  ibuprofen (ADVIL,MOTRIN) 200 MG tablet Take 400 mg by mouth every 6 (six) hours as needed.    Historical Provider, MD  Multiple Vitamin (MULTIVITAMIN WITH MINERALS) TABS Take 1 tablet by mouth daily.    Historical Provider, MD   Physical Exam: Filed Vitals:   11/05/14 1857 11/05/14 1900 11/05/14 2000 11/05/14 2100  BP:  145/61 138/66 135/76  Pulse:  81 71 82  Temp: 97.6 F (36.4 C)     TempSrc: Oral     Resp:  17 15 17   Height:      Weight:      SpO2:  99% 97% 98%    Wt Readings from Last 3 Encounters:  11/05/14 85.73 kg (189 lb)  04/28/14 78.019 kg (172 lb)  07/19/11 90.719 kg (200 lb)    General:  Appears calm and comfortable Eyes: PERRL, normal lids, irises & conjunctiva ENT: grossly normal hearing, lips & tongue Neck: no LAD, masses or thyromegaly Cardiovascular: RRR, no m/r/g. No LE edema. Telemetry: SR, no arrhythmias  Respiratory: CTA bilaterally, no w/r/r. Normal  respiratory effort. Abdomen: soft, ntnd Skin: no rash or induration seen on limited exam Musculoskeletal: grossly normal tone BUE/BLE Psychiatric: grossly normal mood and affect, speech fluent and appropriate Neurologic: grossly non-focal.          Labs on Admission:  Basic Metabolic Panel:  Recent Labs Lab 11/05/14 1920  NA 140  K 3.5*  CL 100  CO2 29  GLUCOSE 87  BUN 15  CREATININE 0.73  CALCIUM 9.2   Liver Function Tests:  Recent Labs Lab 11/05/14 1920  AST 19  ALT 17  ALKPHOS 102  BILITOT <0.2*  PROT 6.9  ALBUMIN 3.4*   No results for input(s): LIPASE, AMYLASE in the last 168 hours. No results for input(s): AMMONIA in the last 168 hours. CBC:  Recent Labs Lab 11/05/14 1920  WBC 8.8  NEUTROABS 5.9  HGB 13.6  HCT 40.1  MCV 87.2  PLT 284   Cardiac Enzymes: No results for input(s): CKTOTAL, CKMB, CKMBINDEX, TROPONINI in the last 168 hours.  BNP (last 3 results) No results for input(s): PROBNP in the last 8760 hours. CBG: No results for input(s): GLUCAP in the last 168 hours.  Radiological Exams on Admission: Dg Chest 2 View  11/05/2014   CLINICAL DATA:  Two-month history of chest pain.  Hypertension  EXAM: CHEST  2 VIEW  COMPARISON:  April 28, 2014  FINDINGS: There is no edema or consolidation. The heart size and pulmonary vascularity are normal. No adenopathy. No pneumothorax. No bone lesions.  IMPRESSION: No edema or consolidation.   Electronically Signed   By: Bretta BangWilliam  Woodruff M.D.   On: 11/05/2014 21:07    EKG: Independently reviewed.NSR  Assessment/Plan Active Problems:   Chest pain   HTN (hypertension)   Hyperlipidemia   1. Chest pain/pressure -will be admitted for observation -check serial enzymes -will likely need a stress test  2. HTN -pressure control -continue with home medications  3. Hyperlipidemia -right now is not on medications -would check labs in am  4. Hypokalemia -will give one time dose of potassium   Code  Status: Full Code (must indicate code status--if unknown or must be presumed, indicate so) DVT Prophylaxis:Heparin Family Communication: None (indicate person spoken with, if applicable, with phone number if by telephone) Disposition Plan: Home (indicate anticipated LOS)  Time spent: 60min  Christ[facility] Schumpert Medical CenterKHAN,SAADAT A [facility] Pager [phone]

## 2014-11-05 NOTE — ED Notes (Signed)
[facility], [facility] Pt was administered 325 mg of [facility]SA [facility]. [facility]. [facility] called EMS per pt because "my EKG was a little whacky and my blood pressure was through the roof and they wanted to be safe." Per [facility] reporting substernal chest pain, rated 3/10 pain, reported pain as heaviness and pressure. [facility] administered 1 nitro tablet at 1741, the pt did experience some relief with the nitro and is now reporting her pain 1/10. Pt reports having substernal chest pain intermittently for the past three months, believes that it may have just been related to stress, denied radiation of pain or shortness of breath with any of the chest pain episodes. Pt states that she felt like her "hearts flipping" in her chest, and that the episodes have been occuring more frequently and lasting longer than usual. Pt measures BP at home and stated that she started to notice that it was increasing the past few days and that this morning it was much higher so she decided to go to the doctors office. Pt stated her blood pressure this morning about 0930 was 165/103.

## 2014-11-05 NOTE — ED Provider Notes (Signed)
CSN: [id number]     Arrival date & time 11/05/14  1802 History   First MD Initiated Contact with Patient 11/05/14 1802     Chief Complaint  Patient presents with  . Chest Pain     (Consider location/radiation/quality/duration/timing/severity/associated sxs/prior Treatment) HPI   Pt with hx HTN, HL,obesity p/w 3/10 intensity intermittent central chest pressure and palpitations, ongoing but becoming more frequent and intense over the past 3 months.  The pain in her chest is "there more often than not" and is accompanied by palpitations, described as "my heart flipping in my chest."  No exacerbating symptoms.  May improve with deep inspiration.  No other associated symptoms.  Initially thought it was related to stress but now with improved stress the symptoms are worse.  Was seen by her PCP today and sent to ED for abnormal EKG and elevated BP with diastolic BP of 111.  Pt was given nitroglycerin by EMS and this relieved her pain.  Has remote cardiac hx of cardiogenic syncope that she describes as her blood pressure spiking then dropping suddenly prior to syncope.  She has been on amlodipine for many years and has not had syncope since.   Has family hx of early CAD - father had first MI in his 6330s and had 4-5 MIs prior to his death in his early 5160s.    Denies fevers, recent illness, leg swelling, recent immobilization, lightheadedness/dizziness/syncope, diaphoresis, nausea, SOB.    PCP Georgette ShellSara Spencer PA-C, [facility], [facility].     Past Medical History  Diagnosis Date  . Hypertension   . Diverticulitis   . Blood transfusion   . Migraine   . Hyperlipemia   . Pneumococcal infection     pt states she has had it 2 times    Past Surgical History  Procedure Laterality Date  . Cesarean section      tubal reversal jan.2010  . Tubal ligation    . Tubal ligation reversal     No family history on file. History  Substance Use Topics  . Smoking status: Never Smoker   . Smokeless  tobacco: Not on file  . Alcohol Use: No   OB History    Gravida Para Term Preterm AB TAB SAB Ectopic Multiple Living   6 5 5       5      Review of Systems  Genitourinary: Positive for urgency and frequency.  All other systems reviewed and are negative.     Allergies  Hydrocodone  Home Medications   Prior to Admission medications   Medication Sig Start Date End Date Taking? Authorizing Provider  amLODipine (NORVASC) 10 MG tablet Take 10 mg by mouth daily.      Historical Provider, MD  azithromycin (ZITHROMAX) 250 MG tablet Take 1 tablet (250 mg total) by mouth daily. Take first 2 tablets together, then 1 every day until finished. 04/28/14   Stevie Kernyan McLennan, MD  Coenzyme Q10 (CO Q 10) 10 MG CAPS Take 2 tablets by mouth daily.    Historical Provider, MD  hydrochlorothiazide (HYDRODIURIL) 25 MG tablet Take 25 mg by mouth daily.    Historical Provider, MD  ibuprofen (ADVIL,MOTRIN) 200 MG tablet Take 400 mg by mouth every 6 (six) hours as needed.    Historical Provider, MD  Multiple Vitamin (MULTIVITAMIN WITH MINERALS) TABS Take 1 tablet by mouth daily.    Historical Provider, MD   BP 163/79 mmHg  Pulse 88  Temp(Src) 97.6 F (36.4 C) (Oral)  Resp 22  Ht 5\' 3"  (1.6 m)  Wt 189 lb (85.73 kg)  BMI 33.49 kg/m2  SpO2 98%  LMP 10/19/2014 Physical Exam  Constitutional: She appears well-developed and well-nourished. No distress.  HENT:  Head: Normocephalic and atraumatic.  Eyes: Conjunctivae are normal.  Neck: Neck supple.  Cardiovascular: Normal rate and regular rhythm.   Pulmonary/Chest: Effort normal and breath sounds normal. No respiratory distress. She has no wheezes. She has no rales. She exhibits no tenderness.  Abdominal: Soft. She exhibits no distension. There is no tenderness. There is no rebound and no guarding.  Musculoskeletal: She exhibits no edema.  Neurological: She is alert.  Skin: She is not diaphoretic.  Psychiatric: She has a normal mood and affect. Her behavior is  normal.  Nursing note and vitals reviewed.   ED Course  Procedures (including critical care time) Labs Review Labs Reviewed  COMPREHENSIVE METABOLIC PANEL - Abnormal; Notable for the following:    Potassium 3.5 (*)    Albumin 3.4 (*)    Total Bilirubin <0.2 (*)    All other components within normal limits  CBC WITH DIFFERENTIAL  URINALYSIS, ROUTINE W REFLEX MICROSCOPIC  PREGNANCY, URINE  I-STAT TROPOININ, ED    Imaging Review Dg Chest 2 View  11/05/2014   CLINICAL DATA:  Two-month history of chest pain.  Hypertension  EXAM: CHEST  2 VIEW  COMPARISON:  April 28, 2014  FINDINGS: There is no edema or consolidation. The heart size and pulmonary vascularity are normal. No adenopathy. No pneumothorax. No bone lesions.  IMPRESSION: No edema or consolidation.   Electronically Signed   By: Bretta BangWilliam  Woodruff M.D.   On: 11/05/2014 21:07     EKG Interpretation   Date/Time:  Tuesday November 05 2014 18:10:48 EST Ventricular Rate:  83 PR Interval:  157 QRS Duration: 92 QT Interval:  389 QTC Calculation: 457 R Axis:   35 Text Interpretation:  Sinus rhythm Probable left atrial enlargement  Anterior infarct, old No significant change since last tracing June 2015  Confirmed by Elesa MassedWARD,  DO, KRISTEN [phone]([id number]) on 11/05/2014 6:19:38 PM      MDM   Final diagnoses:  Chest pain, unspecified chest pain type  Essential hypertension    Pt with hx HTN, HL, obesity with family hx early CAD p/w central chest pressure with associated palpitation with increasing frequency over 3 months.  Somewhat atypical as pt has not noted what factors exacerbate it, unclear if it is in any way exertional.   Relieved with nitroglycerin.  Pt admitted to [facility] for observation overnight.  Accepted by Dr Welton FlakesKhan, who elects to write his own holding orders/ bed request.      Trixie Dredgemily Angle Dirusso, PA-C 11/05/14 2208  Layla MawKristen N Ward, DO 11/05/14 2235

## 2014-11-06 DIAGNOSIS — R3915 Urgency of urination: Secondary | ICD-10-CM | POA: Diagnosis not present

## 2014-11-06 DIAGNOSIS — I10 Essential (primary) hypertension: Secondary | ICD-10-CM | POA: Diagnosis not present

## 2014-11-06 DIAGNOSIS — R079 Chest pain, unspecified: Secondary | ICD-10-CM | POA: Diagnosis not present

## 2014-11-06 DIAGNOSIS — E669 Obesity, unspecified: Secondary | ICD-10-CM | POA: Diagnosis not present

## 2014-11-06 LAB — TROPONIN I
Troponin I: <0.3 ng/mL (ref <0.30)
Troponin I: <0.3 ng/mL (ref <0.30)

## 2014-11-06 MED ORDER — PANTOPRAZOLE SODIUM 40 MG PO TBEC: 40.0000 mg | DELAYED_RELEASE_TABLET | Freq: Every day | ORAL | Status: DC

## 2014-11-06 NOTE — Discharge Summary (Addendum)
Physician Discharge Summary  Tamara RosenthalVanessa A Frazier ZOX:[id number] DOB: 02/26/1971 DOA: 11/05/2014  PCP: Teena IraniSPENCER,SARA C, PA-C  Admit date: 11/05/2014 Discharge date: 11/06/2014  Time spent: 35 minutes  Recommendations for Outpatient Follow-up:  1. Follow up with cards as an outpatient.  Discharge Diagnoses:  Active Problems:   Chest pain   HTN (hypertension)   Hyperlipidemia   Discharge Condition: stable  Diet recommendation: heart healthy  Filed Weights   11/05/14 1818 11/05/14 2253 11/06/14 0531  Weight: 85.73 kg (189 lb) 86.138 kg (189 lb 14.4 oz) 85.957 kg (189 lb 8 oz)    History of present illness:  43 y.o. female presents with chest pain and pressure. Patient states that she has been having a lot of stress at work. She states that she was noting that her symptoms were getting worse over the last 4-5 days. She states that she had noted increased BP readings. She went to her PCP and was noted to have elevated BP. She also had an ECG done and it was told to her that there were some changes. She states that she has had palpitations and her has had her heart flipping in her chest. She states no shortness of breath was noted.   Hospital Course:  Chest pain/pressure - Serial enzymes negative x3. EKG normal sinus rhythm. Pain not reproducible by palpation, not pleuritic. - no events on telemetry. CXR no acute abnormalities. She has had the pain for 1 month. - Echo follow up with cards as an outpatient.  - Stress test as an outpatient.  HTN - continue with home medications  Hyperlipidemia: - Right now is not on medications  Hypokalemia: - will give one time dose of potassium  Procedures:  Echo  cxr  Consultations:  none  Discharge Exam: Filed Vitals:   11/06/14 0531  BP: 137/77  Pulse: 85  Temp: 98.3 F (36.8 C)  Resp:     General: A&O x3 Cardiovascular: RRR Respiratory: good air movement CTA B/L  Discharge Instructions You were cared for by a  hospitalist during your hospital stay. If you have any questions about your discharge medications or the care you received while you were in the hospital after you are discharged, you can call the unit and asked to speak with the hospitalist on call if the hospitalist that took care of you is not available. Once you are discharged, your primary care physician will handle any further medical issues. Please note that NO REFILLS for any discharge medications will be authorized once you are discharged, as it is imperative that you return to your primary care physician (or establish a relationship with a primary care physician if you do not have one) for your aftercare needs so that they can reassess your need for medications and monitor your lab values.  Discharge Instructions    Diet - low sodium heart healthy    Complete by:  As directed      Increase activity slowly    Complete by:  As directed           Current Discharge Medication List    START taking these medications   Details  pantoprazole (PROTONIX) 40 MG tablet Take 1 tablet (40 mg total) by mouth daily. Qty: 30 tablet, Refills: 0      CONTINUE these medications which have NOT CHANGED   Details  amLODipine (NORVASC) 10 MG tablet Take 10 mg by mouth daily.      Coenzyme Q10 (CO Q 10) 10 MG CAPS Take 2  tablets by mouth daily.    hydrochlorothiazide (HYDRODIURIL) 25 MG tablet Take 25 mg by mouth daily.    ibuprofen (ADVIL,MOTRIN) 200 MG tablet Take 400 mg by mouth every 6 (six) hours as needed.    Multiple Vitamin (MULTIVITAMIN WITH MINERALS) TABS Take 1 tablet by mouth daily.      STOP taking these medications     azithromycin (ZITHROMAX) 250 MG tablet        Allergies  Allergen Reactions  . Hydrocodone Nausea And Vomiting      The results of significant diagnostics from this hospitalization (including imaging, microbiology, ancillary and laboratory) are listed below for reference.    Significant Diagnostic  Studies: Dg Chest 2 View  11/05/2014   CLINICAL DATA:  Two-month history of chest pain.  Hypertension  EXAM: CHEST  2 VIEW  COMPARISON:  April 28, 2014  FINDINGS: There is no edema or consolidation. The heart size and pulmonary vascularity are normal. No adenopathy. No pneumothorax. No bone lesions.  IMPRESSION: No edema or consolidation.   Electronically Signed   By: Bretta BangWilliam  Woodruff M.D.   On: 11/05/2014 21:07    Microbiology: No results found for this or any previous visit (from the past 240 hour(s)).   Labs: Basic Metabolic Panel:  Recent Labs Lab 11/05/14 1920  NA 140  K 3.5*  CL 100  CO2 29  GLUCOSE 87  BUN 15  CREATININE 0.73  CALCIUM 9.2   Liver Function Tests:  Recent Labs Lab 11/05/14 1920  AST 19  ALT 17  ALKPHOS 102  BILITOT <0.2*  PROT 6.9  ALBUMIN 3.4*   No results for input(s): LIPASE, AMYLASE in the last 168 hours. No results for input(s): AMMONIA in the last 168 hours. CBC:  Recent Labs Lab 11/05/14 1920  WBC 8.8  NEUTROABS 5.9  HGB 13.6  HCT 40.1  MCV 87.2  PLT 284   Cardiac Enzymes:  Recent Labs Lab 11/05/14 2336 11/06/14 0258 11/06/14 0430  TROPONINI <0.30 <0.30 <0.30   BNP: BNP (last 3 results) No results for input(s): PROBNP in the last 8760 hours. CBG: No results for input(s): GLUCAP in the last 168 hours.     Signed:  Marinda ElkFELIZ ORTIZ, ABRAHAM  [facility] 11/06/2014, 8:19 AM

## 2014-11-06 NOTE — Progress Notes (Signed)
Pt discharged home with husband.  Reviewed discharge instructions and education, all questions answered.  Assessment unchanged from earlier.  

## 2014-11-13 ENCOUNTER — Encounter: Payer: Self-pay | Admitting: Cardiology

## 2014-11-13 ENCOUNTER — Ambulatory Visit (INDEPENDENT_AMBULATORY_CARE_PROVIDER_SITE_OTHER): Payer: BC Managed Care – PPO | Admitting: Cardiology

## 2014-11-13 VITALS — BP 144/90 | HR 92 | Ht 62.0 in | Wt 192.5 lb

## 2014-11-13 DIAGNOSIS — I10 Essential (primary) hypertension: Secondary | ICD-10-CM

## 2014-11-13 DIAGNOSIS — R072 Precordial pain: Secondary | ICD-10-CM

## 2014-11-13 NOTE — Patient Instructions (Addendum)
Your physician recommends that you schedule a follow-up appointment in: as needed with Dr. Antoine PocheHochrein  We are ordering a stress test  We are ordering a CT Calcium Score

## 2014-11-13 NOTE — Progress Notes (Signed)
HPI I saw her in 2011 for syncope.  She did not have a clear cardiac etiology at that time.  She presents now for followup after recent hospitalization for chest pain.  She was hospitalized recently and I reviewed these records. She had chest discomfort but no evidence of ischemia. There was mention of her having potentially an outpatient stress test. She says she still has this heaviness. It happens sporadically. She can't bring it on. She's probably more concerned about palpitations. She has these sporadically as well. She had her last episode a few days ago. I feel like isolated skipped beats. She does not have any presyncope or syncope. She does not have any neck or arm discomfort. She is active at work although she doesn't exercise. She has gained weight because she was working so much and not eating well. She's recently had blood problems and actually has had her Norvasc dose increased and her hydrochlorothiazide double.  Allergies  Allergen Reactions  . Hydrocodone Nausea And Vomiting    Current Outpatient Prescriptions  Medication Sig Dispense Refill  . amLODipine (NORVASC) 10 MG tablet Take 10 mg by mouth daily.      Marland Kitchen. aspirin 325 MG tablet Take 325 mg by mouth daily.    . Coenzyme Q10 (CO Q 10) 10 MG CAPS Take 2 tablets by mouth daily.    . CRESTOR 10 MG tablet   0  . hydrochlorothiazide (HYDRODIURIL) 25 MG tablet Take 25 mg by mouth daily.    Marland Kitchen. ibuprofen (ADVIL,MOTRIN) 200 MG tablet Take 400 mg by mouth every 6 (six) hours as needed.    . Multiple Vitamin (MULTIVITAMIN WITH MINERALS) TABS Take 1 tablet by mouth daily.    . pantoprazole (PROTONIX) 40 MG tablet Take 1 tablet (40 mg total) by mouth daily. 30 tablet 0   No current facility-administered medications for this visit.    Past Medical History  Diagnosis Date  . Hypertension   . Diverticulitis   . Blood transfusion   . Migraine   . Hyperlipemia   . Pneumococcal infection     pt states she has had it 2 times      Past Surgical History  Procedure Laterality Date  . Cesarean section      tubal reversal jan.2010  . Tubal ligation    . Tubal ligation reversal      No family history on file.  History   Social History  . Marital Status: Married    Spouse Name: N/A    Number of Children: N/A  . Years of Education: N/A   Occupational History  . Not on file.   Social History Main Topics  . Smoking status: Never Smoker   . Smokeless tobacco: Not on file  . Alcohol Use: No  . Drug Use: No  . Sexual Activity: Yes   Other Topics Concern  . Not on file   Social History Narrative    ROS:  As stated in the HPI and negative for all other systems.   PHYSICAL EXAM BP 144/90 mmHg  Pulse 92  Ht 5\' 2"  (1.575 m)  Wt 192 lb 8 oz (87.317 kg)  BMI 35.20 kg/m2  LMP 10/19/2014  GENERAL:  Well appearing HEENT:  Pupils equal round and reactive, fundi not visualized, oral mucosa unremarkable NECK:  No jugular venous distention, waveform within normal limits, carotid upstroke brisk and symmetric, no bruits, no thyromegaly LYMPHATICS:  No cervical, inguinal adenopathy LUNGS:  Clear to auscultation bilaterally BACK:  No  CVA tenderness CHEST:  Unremarkable HEART:  PMI not displaced or sustained,S1 and S2 within normal limits, no S3, no S4, no clicks, no rubs, no murmurs ABD:  Flat, positive bowel sounds normal in frequency in pitch, no bruits, no rebound, no guarding, no midline pulsatile mass, no hepatomegaly, no splenomegaly EXT:  2 plus pulses throughout, no edema, no cyanosis no clubbing SKIN:  No rashes no nodules NEURO:  Cranial nerves II through XII grossly intact, motor grossly intact throughout PSYCH:  Cognitively intact, oriented to person place and time   EKG:   Sinus rhythm, rate 83, axis within normal limits, intervals within normal limits, no acute ST-T wave changes found for anterior R wave progression. 11/13/2014   ASSESSMENT AND PLAN  CHEST PAIN:    The pain is somewhat  atypical but she does have a strong family history. Given this stress testing is indicated. She will have a POET (Plain Old Exercise Treadmill).  Of note with a family history he also discussed the utility of a coronary calcium score and she would like to have this. This will further guide therapy.  HTN:  Blood pressures he is slightly elevated.  However, she's just recently had her meds changed. We discussed weight loss and I suspect with this and the current meds her blood pressure will be at target.  PALPITATIONS:  We discussed these and if they recur we might add or substitute Cardizem or a beta blocker

## 2014-11-18 ENCOUNTER — Telehealth: Payer: Self-pay | Admitting: Cardiology

## 2014-11-18 ENCOUNTER — Telehealth (HOSPITAL_COMMUNITY): Payer: Self-pay | Admitting: *Deleted

## 2014-11-18 NOTE — Telephone Encounter (Signed)
Left message for patient to call back regarding appointment for CT Calcium Scoring---[facility]---arrive 9:45 am for check in and patient needs to take $150.00..Marland Kitchen

## 2014-11-18 NOTE — Telephone Encounter (Signed)
Spoke with patient at 2:05pm--she states she had spoken with someone earlier regarding her appointment for the CT calcium scoring that is scheduled for 11/21/14 @ 10:00am.  She is also aware that she needs to bring $150.00 to that appointment.

## 2014-11-21 ENCOUNTER — Ambulatory Visit (INDEPENDENT_AMBULATORY_CARE_PROVIDER_SITE_OTHER)
Admission: RE | Admit: 2014-11-21 | Discharge: 2014-11-21 | Disposition: A | Discharge status: Home / Self Care | Payer: Self-pay | Source: Ambulatory Visit | Attending: Cardiology | Admitting: Cardiology

## 2014-11-21 DIAGNOSIS — I10 Essential (primary) hypertension: Secondary | ICD-10-CM

## 2014-11-21 DIAGNOSIS — R072 Precordial pain: Secondary | ICD-10-CM

## 2014-11-25 ENCOUNTER — Telehealth: Payer: Self-pay | Admitting: Cardiology

## 2014-11-25 NOTE — Telephone Encounter (Signed)
Pt is calling to get the results to her Calcium test that was done on 12/31. Please call  Thanks

## 2014-11-25 NOTE — Telephone Encounter (Signed)
LMTCB - calcium score ZERO

## 2014-11-25 NOTE — Telephone Encounter (Signed)
Patient notified of normal coronary calcium score.

## 2014-11-29 ENCOUNTER — Encounter (HOSPITAL_COMMUNITY): Payer: Self-pay | Admitting: *Deleted

## 2014-12-12 ENCOUNTER — Encounter (HOSPITAL_COMMUNITY): Payer: BLUE CROSS/BLUE SHIELD

## 2015-11-23 HISTORY — PX: IUD REMOVAL: SHX5392

## 2016-06-15 ENCOUNTER — Other Ambulatory Visit: Payer: Self-pay | Admitting: Obstetrics and Gynecology

## 2016-06-17 ENCOUNTER — Encounter (HOSPITAL_COMMUNITY)
Admission: RE | Admit: 2016-06-17 | Discharge: 2016-06-17 | Disposition: A | Discharge status: Home / Self Care | Payer: BLUE CROSS/BLUE SHIELD | Source: Ambulatory Visit | Attending: Obstetrics and Gynecology | Admitting: Obstetrics and Gynecology

## 2016-06-17 ENCOUNTER — Encounter (HOSPITAL_COMMUNITY): Payer: Self-pay

## 2016-06-17 DIAGNOSIS — T8332XA Displacement of intrauterine contraceptive device, initial encounter: Secondary | ICD-10-CM | POA: Diagnosis present

## 2016-06-17 HISTORY — DX: Family history of other specified conditions: Z84.89

## 2016-06-17 HISTORY — DX: Other specified postprocedural states: Z98.890

## 2016-06-17 HISTORY — DX: Adverse effect of unspecified anesthetic, initial encounter: T41.45XA

## 2016-06-17 HISTORY — DX: Other complications of anesthesia, initial encounter: T88.59XA

## 2016-06-17 HISTORY — DX: Nausea with vomiting, unspecified: R11.2

## 2016-06-17 LAB — CBC
HEMATOCRIT: 41.6 % (ref 36.0–46.0)
HEMOGLOBIN: 14.7 g/dL (ref 12.0–15.0)
MCH: 30.4 pg (ref 26.0–34.0)
MCHC: 35.3 g/dL (ref 30.0–36.0)
MCV: 86.1 fL (ref 78.0–100.0)
Platelets: 338 10*3/uL (ref 150–400)
RBC: 4.83 MIL/uL (ref 3.87–5.11)
RDW: 12.9 % (ref 11.5–15.5)
WBC: 8.2 10*3/uL (ref 4.0–10.5)

## 2016-06-17 LAB — TYPE AND SCREEN
ABO/RH(D): B POS
Antibody Screen: NEGATIVE

## 2016-06-17 MED ORDER — CEFAZOLIN SODIUM-DEXTROSE 2-4 GM/100ML-% IV SOLN
2.0000 g | INTRAVENOUS | Status: AC
Start: 2016-06-18 — End: 2016-06-18
  Administered 2016-06-18: 2 g via INTRAVENOUS

## 2016-06-17 NOTE — Patient Instructions (Signed)
Your procedure is scheduled on:06/18/16  Enter through the Main Entrance at :2:15pm Pick up desk phone and dial [phone] and inform us of your arrival.  Please call [phone] if you have any problems the morning of surgery.  Remember: Do not eat food after midnight:tonight Clear liquids are ok until:11:45 am   You may brush your teeth the morning of surgery.  Take these meds the morning of surgery with a sip of water:Amlodipine  DO NOT wear jewelry, eye make-up, lipstick,body lotion, or dark fingernail polish.  (Polished toes are ok) You may wear deodorant.  If you are to be admitted after surgery, leave suitcase in car until your room has been assigned. Patients discharged on the day of surgery will not be allowed to drive home. Wear loose fitting, comfortable clothes for your ride home.

## 2016-06-18 ENCOUNTER — Ambulatory Visit (HOSPITAL_COMMUNITY)
Admission: RE | Admit: 2016-06-18 | Discharge: 2016-06-18 | Disposition: A | Discharge status: Home / Self Care | Payer: BLUE CROSS/BLUE SHIELD | Source: Ambulatory Visit | Attending: Obstetrics and Gynecology | Admitting: Obstetrics and Gynecology

## 2016-06-18 ENCOUNTER — Ambulatory Visit (HOSPITAL_COMMUNITY): Payer: BLUE CROSS/BLUE SHIELD | Admitting: Anesthesiology

## 2016-06-18 ENCOUNTER — Ambulatory Visit: Admit: 2016-06-18 | Payer: Self-pay | Admitting: Obstetrics and Gynecology

## 2016-06-18 ENCOUNTER — Encounter (HOSPITAL_COMMUNITY): Payer: Self-pay | Admitting: *Deleted

## 2016-06-18 ENCOUNTER — Encounter (HOSPITAL_COMMUNITY)
Admission: RE | Disposition: A | Discharge status: Home / Self Care | Payer: Self-pay | Source: Ambulatory Visit | Attending: Obstetrics and Gynecology

## 2016-06-18 DIAGNOSIS — T8332XA Displacement of intrauterine contraceptive device, initial encounter: Secondary | ICD-10-CM | POA: Insufficient documentation

## 2016-06-18 HISTORY — PX: HYSTEROSCOPY: SHX211

## 2016-06-18 SURGERY — HYSTEROSCOPY
Anesthesia: Choice

## 2016-06-18 SURGERY — HYSTEROSCOPY
Anesthesia: General | Site: Vagina

## 2016-06-18 MED ORDER — SUCCINYLCHOLINE CHLORIDE 20 MG/ML IJ SOLN
INTRAMUSCULAR | Status: DC | PRN
  Administered 2016-06-18: 100 mg via INTRAVENOUS

## 2016-06-18 MED ORDER — PROPOFOL 10 MG/ML IV BOLUS
INTRAVENOUS | Status: DC | PRN
  Administered 2016-06-18: 200 mg via INTRAVENOUS

## 2016-06-18 MED ORDER — KETOROLAC TROMETHAMINE 30 MG/ML IJ SOLN
INTRAMUSCULAR | Status: AC
  Filled 2016-06-18: qty 1

## 2016-06-18 MED ORDER — FENTANYL CITRATE (PF) 250 MCG/5ML IJ SOLN
INTRAMUSCULAR | Status: AC
  Filled 2016-06-18: qty 5

## 2016-06-18 MED ORDER — SODIUM CHLORIDE 0.9 % IJ SOLN
INTRAMUSCULAR | Status: AC
  Filled 2016-06-18: qty 50

## 2016-06-18 MED ORDER — HYDROMORPHONE HCL 1 MG/ML IJ SOLN
INTRAMUSCULAR | Status: AC
  Filled 2016-06-18: qty 1

## 2016-06-18 MED ORDER — VASOPRESSIN 20 UNIT/ML IV SOLN
INTRAVENOUS | Status: DC | PRN
  Administered 2016-06-18: 20 mL via INTRAMUSCULAR

## 2016-06-18 MED ORDER — SCOPOLAMINE 1 MG/3DAYS TD PT72
MEDICATED_PATCH | TRANSDERMAL | Status: AC
  Administered 2016-06-18: 1.5 mg via TRANSDERMAL
  Filled 2016-06-18: qty 1

## 2016-06-18 MED ORDER — DEXAMETHASONE SODIUM PHOSPHATE 10 MG/ML IJ SOLN
INTRAMUSCULAR | Status: DC | PRN
  Administered 2016-06-18: 4 mg via INTRAVENOUS

## 2016-06-18 MED ORDER — LACTATED RINGERS IV SOLN
INTRAVENOUS | Status: DC
  Administered 2016-06-18: 12:00:00 via INTRAVENOUS

## 2016-06-18 MED ORDER — FENTANYL CITRATE (PF) 100 MCG/2ML IJ SOLN
INTRAMUSCULAR | Status: DC | PRN
  Administered 2016-06-18: 150 ug via INTRAVENOUS
  Administered 2016-06-18: 100 ug via INTRAVENOUS

## 2016-06-18 MED ORDER — MIDAZOLAM HCL 2 MG/2ML IJ SOLN
INTRAMUSCULAR | Status: AC
  Filled 2016-06-18: qty 2

## 2016-06-18 MED ORDER — MIDAZOLAM HCL 2 MG/2ML IJ SOLN
INTRAMUSCULAR | Status: DC | PRN
  Administered 2016-06-18: 2 mg via INTRAVENOUS

## 2016-06-18 MED ORDER — MEPERIDINE HCL 25 MG/ML IJ SOLN: 6.2500 mg | INTRAMUSCULAR | Status: DC | PRN

## 2016-06-18 MED ORDER — OXYCODONE HCL 5 MG PO TABS: 5.0000 mg | ORAL_TABLET | Freq: Once | ORAL | Status: DC | PRN

## 2016-06-18 MED ORDER — BUPIVACAINE HCL (PF) 0.25 % IJ SOLN
INTRAMUSCULAR | Status: AC
  Filled 2016-06-18: qty 30

## 2016-06-18 MED ORDER — LIDOCAINE HCL (CARDIAC) 20 MG/ML IV SOLN
INTRAVENOUS | Status: DC | PRN
  Administered 2016-06-18: 100 mg via INTRAVENOUS

## 2016-06-18 MED ORDER — LIDOCAINE HCL (CARDIAC) 20 MG/ML IV SOLN
INTRAVENOUS | Status: AC
  Filled 2016-06-18: qty 5

## 2016-06-18 MED ORDER — ONDANSETRON HCL 4 MG/2ML IJ SOLN
INTRAMUSCULAR | Status: AC
  Filled 2016-06-18: qty 2

## 2016-06-18 MED ORDER — VASOPRESSIN 20 UNIT/ML IV SOLN
INTRAVENOUS | Status: AC
  Filled 2016-06-18: qty 1

## 2016-06-18 MED ORDER — SCOPOLAMINE 1 MG/3DAYS TD PT72
1.0000 | MEDICATED_PATCH | Freq: Once | TRANSDERMAL | Status: DC
  Administered 2016-06-18: 1.5 mg via TRANSDERMAL

## 2016-06-18 MED ORDER — OXYCODONE HCL 5 MG/5ML PO SOLN: 5.0000 mg | Freq: Once | ORAL | Status: DC | PRN

## 2016-06-18 MED ORDER — SODIUM CHLORIDE 0.9 % IR SOLN
Status: DC | PRN
  Administered 2016-06-18: 1540 mL

## 2016-06-18 MED ORDER — DEXAMETHASONE SODIUM PHOSPHATE 4 MG/ML IJ SOLN
INTRAMUSCULAR | Status: AC
  Filled 2016-06-18: qty 1

## 2016-06-18 MED ORDER — ONDANSETRON HCL 4 MG/2ML IJ SOLN
INTRAMUSCULAR | Status: DC | PRN
  Administered 2016-06-18: 4 mg via INTRAVENOUS

## 2016-06-18 MED ORDER — SODIUM CHLORIDE 0.9 % IJ SOLN
INTRAMUSCULAR | Status: AC
  Filled 2016-06-18: qty 10

## 2016-06-18 MED ORDER — ROCURONIUM BROMIDE 100 MG/10ML IV SOLN
INTRAVENOUS | Status: AC
  Filled 2016-06-18: qty 1

## 2016-06-18 MED ORDER — HYDROMORPHONE HCL 1 MG/ML IJ SOLN
INTRAMUSCULAR | Status: AC
  Administered 2016-06-18: 0.5 mg via INTRAVENOUS
  Filled 2016-06-18: qty 1

## 2016-06-18 MED ORDER — HYDROMORPHONE HCL 1 MG/ML IJ SOLN
0.2500 mg | INTRAMUSCULAR | Status: DC | PRN
  Administered 2016-06-18: 0.5 mg via INTRAVENOUS
  Administered 2016-06-18: 0.25 mg via INTRAVENOUS
  Administered 2016-06-18: 0.5 mg via INTRAVENOUS

## 2016-06-18 MED ORDER — KETOROLAC TROMETHAMINE 30 MG/ML IJ SOLN
INTRAMUSCULAR | Status: DC | PRN
  Administered 2016-06-18: 30 mg via INTRAVENOUS

## 2016-06-18 MED ORDER — PROPOFOL 10 MG/ML IV BOLUS
INTRAVENOUS | Status: AC
  Filled 2016-06-18: qty 20

## 2016-06-18 SURGICAL SUPPLY — 33 items
CABLE HIGH FREQUENCY MONO STRZ (ELECTRODE) IMPLANT
CANISTER SUCT 3000ML (MISCELLANEOUS) ×3 IMPLANT
CATH ROBINSON RED A/P 16FR (CATHETERS) ×3 IMPLANT
CLOTH BEACON ORANGE TIMEOUT ST (SAFETY) ×3 IMPLANT
CONTAINER PREFILL 10% NBF 60ML (FORM) ×6 IMPLANT
DRSG COVADERM PLUS 2X2 (GAUZE/BANDAGES/DRESSINGS) ×6 IMPLANT
DRSG OPSITE POSTOP 3X4 (GAUZE/BANDAGES/DRESSINGS) IMPLANT
FORCEPS CUTTING 33CM 5MM (CUTTING FORCEPS) IMPLANT
FORCEPS CUTTING 45CM 5MM (CUTTING FORCEPS) IMPLANT
GLOVE BIO SURGEON STRL SZ7.5 (GLOVE) ×3 IMPLANT
GLOVE BIOGEL PI IND STRL 7.0 (GLOVE) ×2 IMPLANT
GLOVE BIOGEL PI INDICATOR 7.0 (GLOVE) ×1
GOWN STRL REUS W/TWL LRG LVL3 (GOWN DISPOSABLE) ×9 IMPLANT
LIQUID BAND (GAUZE/BANDAGES/DRESSINGS) ×3 IMPLANT
NEEDLE INSUFFLATION 120MM (ENDOMECHANICALS) ×3 IMPLANT
PACK LAPAROSCOPY BASIN (CUSTOM PROCEDURE TRAY) ×3 IMPLANT
PACK VAGINAL MINOR WOMEN LF (CUSTOM PROCEDURE TRAY) ×3 IMPLANT
PAD OB MATERNITY 4.3X12.25 (PERSONAL CARE ITEMS) ×3 IMPLANT
PAD TRENDELENBURG POSITION (MISCELLANEOUS) ×3 IMPLANT
SET IRRIG TUBING LAPAROSCOPIC (IRRIGATION / IRRIGATOR) IMPLANT
SLEEVE XCEL OPT CAN 5 100 (ENDOMECHANICALS) ×3 IMPLANT
SOLUTION ELECTROLUBE (MISCELLANEOUS) ×3 IMPLANT
SUT VICRYL 0 UR6 27IN ABS (SUTURE) ×3 IMPLANT
SUT VICRYL 4-0 PS2 18IN ABS (SUTURE) ×6 IMPLANT
SYR TB 1ML 25GX5/8 (SYRINGE) ×3 IMPLANT
TOWEL OR 17X24 6PK STRL BLUE (TOWEL DISPOSABLE) ×6 IMPLANT
TRAY FOLEY CATH SILVER 14FR (SET/KITS/TRAYS/PACK) ×3 IMPLANT
TROCAR OPTI TIP 5M 100M (ENDOMECHANICALS) ×6 IMPLANT
TROCAR XCEL DIL TIP R 11M (ENDOMECHANICALS) ×3 IMPLANT
TUBING AQUILEX INFLOW (TUBING) ×3 IMPLANT
TUBING AQUILEX OUTFLOW (TUBING) ×3 IMPLANT
WARMER LAPAROSCOPE (MISCELLANEOUS) ×3 IMPLANT
WATER STERILE IRR 1000ML POUR (IV SOLUTION) ×3 IMPLANT

## 2016-06-18 NOTE — Discharge Instructions (Signed)
DISCHARGE INSTRUCTIONS: HYSTEROSCOPY  The following instructions have been prepared to help you care for yourself upon your return home.  May Remove Scop patch on or before 06/21/16  May take Ibuprofen after 8:30pm today.  May take stool softner while taking narcotic pain medication to prevent constipation.  Drink plenty of water.  Personal hygiene:  Use sanitary pads for vaginal drainage, not tampons.  Shower the day after your procedure.  NO tub baths, pools or Jacuzzis for 2-3 weeks.  Wipe front to back after using the bathroom.  Activity and limitations:  Do NOT drive or operate any equipment for 24 hours. The effects of anesthesia are still present and drowsiness may result.  Do NOT rest in bed all day.  Walking is encouraged.  Walk up and down stairs slowly.  You may resume your normal activity in one to two days or as indicated by your physician. Sexual activity: NO intercourse for at least 2 weeks after the procedure, or as indicated by your Doctor.  Diet: Eat a light meal as desired this evening. You may resume your usual diet tomorrow.  Return to Work: You may resume your work activities in one to two days or as indicated by Therapist, sports.  What to expect after your surgery: Expect to have vaginal bleeding/discharge for 2-3 days and spotting for up to 10 days. It is not unusual to have soreness for up to 1-2 weeks. You may have a slight burning sensation when you urinate for the first day. Mild cramps may continue for a couple of days. You may have a regular period in 2-6 weeks.  Call your doctor for any of the following:  Excessive vaginal bleeding or clotting, saturating and changing one pad every hour.  Inability to urinate 6 hours after discharge from hospital.  Pain not relieved by pain medication.  Fever of 100.4 F or greater.  Unusual vaginal discharge or odor.  Return to office _________________Call for an appointment  ___________________ Patients signature: ______________________ Nurses signature ________________________  Post Anesthesia Care Unit [phone]

## 2016-06-18 NOTE — Transfer of Care (Signed)
Immediate Anesthesia Transfer of Care Note  Patient: Tamara Frazier  Procedure(s) Performed: Procedure(s) with comments: DX HYSTEROSCOPY  with removal of embedded IUD (N/A) - 1hr.  Patient Location: PACU  Anesthesia Type:General  Level of Consciousness: awake, alert  and oriented  Airway & Oxygen Therapy: Patient Spontanous Breathing and Patient connected to nasal cannula oxygen  Post-op Assessment: Report given to RN, Post -op Vital signs reviewed and stable and Patient moving all extremities  Post vital signs: Reviewed and stable  Last Vitals:  Vitals:   06/18/16 1218  BP: (!) 142/93  Pulse: 84  Resp: 18  Temp: 36.7 C    Last Pain:  Vitals:   06/18/16 1218  TempSrc: Oral  PainSc: 3       Patients Stated Pain Goal: 3 (06/18/16 1218)  Complications: No apparent anesthesia complications

## 2016-06-18 NOTE — Anesthesia Preprocedure Evaluation (Signed)
Anesthesia Evaluation  Patient identified by MRN, date of birth, ID band Patient awake    Reviewed: Allergy & Precautions, NPO status , Patient's Chart, lab work & pertinent test results  History of Anesthesia Complications (+) PONV and Family history of anesthesia reactionHistory of anesthetic complications: Father had pancreatitis post CABG.  Airway Mallampati: I  TM Distance: >3 FB Neck ROM: Full    Dental  (+) Teeth Intact, Dental Advisory Given   Pulmonary    breath sounds clear to auscultation       Cardiovascular  Rhythm:Regular Rate:Normal     Neuro/Psych    GI/Hepatic   Endo/Other    Renal/GU      Musculoskeletal   Abdominal   Peds  Hematology   Anesthesia Other Findings   Reproductive/Obstetrics                             Anesthesia Physical Anesthesia Plan  ASA: II  Anesthesia Plan: General   Post-op Pain Management:    Induction: Intravenous  Airway Management Planned: LMA  Additional Equipment:   Intra-op Plan:   Post-operative Plan: Extubation in OR  Informed Consent: I have reviewed the patients History and Physical, chart, labs and discussed the procedure including the risks, benefits and alternatives for the proposed anesthesia with the patient or authorized representative who has indicated his/her understanding and acceptance.   Dental advisory given  Plan Discussed with: CRNA, Anesthesiologist and Surgeon  Anesthesia Plan Comments:         Anesthesia Quick Evaluation

## 2016-06-18 NOTE — Op Note (Signed)
06/18/2016  2:37 PM  PATIENT:  Tamara Frazier  45 y.o. female  PRE-OPERATIVE DIAGNOSIS:  IUD Complication/Malpositioned  POST-OPERATIVE DIAGNOSIS:  IUD complication/Malpositioned  PROCEDURE:  Procedure(s): DX HYSTEROSCOPY  with removal of embedded IUD  SURGEON:  Surgeon(s): Olivia Mackie, MD  ASSISTANTS: none   ANESTHESIA:   general  ESTIMATED BLOOD LOSS: minimal  DRAINS: none   LOCAL MEDICATIONS USED:  NONE  SPECIMEN:  No Specimen  DISPOSITION OF SPECIMEN:  PATHOLOGY  COUNTS:  YES  DICTATION #: [id number]  PLAN OF CARE: dc home  PATIENT DISPOSITION:  PACU - hemodynamically stable.

## 2016-06-18 NOTE — Progress Notes (Signed)
Patient ID: Tamara Frazier, female   DOB: Dec 03, 1970, 45 y.o.   MRN: [id number] Patient seen and examined. Consent witnessed and signed. No changes noted. Update completed.

## 2016-06-18 NOTE — Anesthesia Procedure Notes (Signed)
Procedure Name: Intubation Date/Time: 06/18/2016 2:00 PM Performed by: Elgie Congo Pre-anesthesia Checklist: Patient identified, Emergency Drugs available, Suction available and Patient being monitored Patient Re-evaluated:Patient Re-evaluated prior to inductionOxygen Delivery Method: Circle system utilized Preoxygenation: Pre-oxygenation with 100% oxygen Intubation Type: IV induction Ventilation: Mask ventilation without difficulty Laryngoscope Size: Mac and 3 Grade View: Grade I Tube type: Oral Number of attempts: 1 Airway Equipment and Method: Stylet Placement Confirmation: ETT inserted through vocal cords under direct vision,  positive ETCO2 and breath sounds checked- equal and bilateral Secured at: 20 cm Tube secured with: Tape Dental Injury: Teeth and Oropharynx as per pre-operative assessment

## 2016-06-18 NOTE — H&P (Signed)
NAMEMarland Kitchen  Tamara Frazier, Tamara Frazier                ACCOUNT NO.:  [id number]  MEDICAL RECORD NO.:  [id number]  LOCATION:  PERIO                         FACILITY:  [facility]  PHYSICIAN:  Lenoard Aden, M.D.DATE OF BIRTH:  Apr 13, 1971  DATE OF ADMISSION:  06/15/2016 DATE OF DISCHARGE:                             HISTORY & PHYSICAL   PREOPERATIVE DIAGNOSIS:  IUD complication with incarcerated IUD.  HISTORY OF PRESENT ILLNESS:  She is a 45 year old white female, G6, P5, who presented to the office with increased bleeding with notable IUD extruding from her cervix.  Attempts to remove the IUD under ultrasound guidance were unsuccessful with breakage of the IUD noted.  She presents now for hysteroscopy and possible laparoscopy for removal.  ALLERGIES:  She has no known allergies.  MEDICATIONS:  Include Mirena, hydrochlorothiazide, amlodipine, vitamin B12, and CoQ10.  PAST MEDICAL HISTORY:  She has a personal history of hypertension.  FAMILY HISTORY:  Hypertension, heart disease, diabetes, throat cancer.  PAST SURGICAL HISTORY:  She has a history of C-section x1, vaginal delivery x4, and abortion uncomplicated x1.  No other medical or surgical hospitalizations noted.  PHYSICAL EXAMINATION:  GENERAL:  She is a well-developed, well-nourished white female, in no acute distress. HEENT:  Normal. NECK:  Supple.  Full range of motion. LUNGS:  Clear. HEART:  Regular rate and rhythm. ABDOMEN:  Soft, nontender. PELVIC:  Uterus to be bulky, anteflexed.  No adnexal masses. EXTREMITIES:  There are no cords. NEUROLOGIC:  Nonfocal. SKIN:  Intact.  IMPRESSION:  Malpositioned IUD and unable to be removed vaginally with secondary pain and discomfort requiring narcotic pain relief.  PLAN:  The plan is to proceed with diagnostic hysteroscopy and possible laparoscopy with intended removal of IUD.  Risks and benefits were discussed.  All complications of the procedure noted. The patient acknowledges and wishes to  proceed. If LS needed, pt desires TL. Failure risks noted.     Lenoard Aden, M.D.     RJT/MEDQ  D:  06/18/2016  T:  06/18/2016  Job:  [id number]

## 2016-06-19 NOTE — Op Note (Signed)
NAMEMarland Frazier  BREEAH, ZELLMAN                ACCOUNT NO.:  [id number]  MEDICAL RECORD NO.:  [id number]  LOCATION:                                 FACILITY:  PHYSICIAN:  Lenoard Aden, M.D.DATE OF BIRTH:  08/31/1971  DATE OF PROCEDURE: DATE OF DISCHARGE:                              OPERATIVE REPORT   PREOPERATIVE DIAGNOSIS:  Embedded malpositioned IUD.  POSTOPERATIVE DIAGNOSIS:  Embedded malpositioned IUD.  PROCEDURES:  Diagnostic hysteroscopy with resection, removal of embedded endocervical IUD.  SURGEON:  Lenoard Aden, M.D.  ASSISTANT:  None.  ANESTHESIA:  General.  ESTIMATED BLOOD LOSS:  Less than 50 mL.  COMPLICATIONS:  None.  FLUID DEFICIT:  100 mL.  The patient is recovering, in good condition.  BRIEF OPERATIVE NOTE:  After being apprised of risks of anesthesia, infection, bleeding, injury to surrounding organs, possible need for repair, delayed versus immediate complications to include bowel and bladder injury, possible need for repair, patient was brought to the operating room, where she was administered general anesthetic without complications.  Prepped and draped in usual sterile fashion, catheterized until the bladder was empty.  Exam under anesthesia revealed anteflexed uterus and no adnexal masses.  At this time, a dilute Pitressin solution was placed at 3 and 9 o'clock cervical vaginal junction and cervix easily dilated up to 23 Pratt dilator.  Hysteroscope was placed.  Visualization revealed normal endometrial cavity, bilateral normal tubal ostia.  At this time, the scope was then gently moved down into endocervix where one of the arms of the IUD was seen to be embedded deeply into the posterior cervical stroma.  Resection was performed around this site using graspers and shears to free some of the scar tissue around.  Good hemostasis was noted.  The grasper was used to slightly dislodge, but was unable to be removed using the grasper, so a polyp  forceps is guided to the area and is used to extract the now slightly freed IUD in total.  The IUD was inspected and found to be otherwise intact.  No evidence of fragmentation or breakage. Hysteroscope was then replaced and the endocervix was reinspected revealing good hemostasis and no evidence of bleeding.  The endometrial cavity also appeared normal.  At this time, procedure was terminated. The patient was awakened and transferred to Recovery in good condition.     Lenoard Aden, M.D.     RJT/MEDQ  D:  06/18/2016  T:  06/18/2016  Job:  [id number]  cc:   Lenoard Aden, M.D. Fax: [phone]

## 2016-06-19 NOTE — Anesthesia Postprocedure Evaluation (Signed)
Anesthesia Post Note  Patient: Tamara Frazier  Procedure(s) Performed: Procedure(s) (LRB): DX HYSTEROSCOPY  with removal of embedded IUD (N/A)  Patient location during evaluation: PACU Anesthesia Type: General Level of consciousness: awake and alert Pain management: pain level controlled Vital Signs Assessment: post-procedure vital signs reviewed and stable Respiratory status: spontaneous breathing, nonlabored ventilation and respiratory function stable Cardiovascular status: blood pressure returned to baseline and stable Postop Assessment: no signs of nausea or vomiting Anesthetic complications: no     Last Vitals:  Vitals:   06/18/16 1630 06/18/16 1700  BP: 116/70 135/84  Pulse: 77 85  Resp: 10 16  Temp: 36.8 C 36.7 C    Last Pain:  Vitals:   06/18/16 1700  TempSrc:   PainSc: 0-No pain   Pain Goal: Patients Stated Pain Goal: 3 (06/18/16 1218)               Mira Balon A

## 2016-06-24 ENCOUNTER — Encounter (HOSPITAL_COMMUNITY): Payer: Self-pay | Admitting: Obstetrics and Gynecology

## 2016-12-07 DIAGNOSIS — N912 Amenorrhea, unspecified: Secondary | ICD-10-CM | POA: Diagnosis not present

## 2016-12-07 DIAGNOSIS — I10 Essential (primary) hypertension: Secondary | ICD-10-CM | POA: Diagnosis not present

## 2016-12-07 DIAGNOSIS — R300 Dysuria: Secondary | ICD-10-CM | POA: Diagnosis not present

## 2016-12-07 DIAGNOSIS — E669 Obesity, unspecified: Secondary | ICD-10-CM | POA: Diagnosis not present

## 2017-01-11 DIAGNOSIS — M940 Chondrocostal junction syndrome [Tietze]: Secondary | ICD-10-CM | POA: Diagnosis not present

## 2017-01-11 DIAGNOSIS — I10 Essential (primary) hypertension: Secondary | ICD-10-CM | POA: Diagnosis not present

## 2017-01-11 DIAGNOSIS — R079 Chest pain, unspecified: Secondary | ICD-10-CM | POA: Diagnosis not present

## 2017-01-24 DIAGNOSIS — Z8249 Family history of ischemic heart disease and other diseases of the circulatory system: Secondary | ICD-10-CM | POA: Diagnosis not present

## 2017-01-24 DIAGNOSIS — I10 Essential (primary) hypertension: Secondary | ICD-10-CM | POA: Diagnosis not present

## 2017-02-01 DIAGNOSIS — I10 Essential (primary) hypertension: Secondary | ICD-10-CM | POA: Diagnosis not present

## 2017-02-01 DIAGNOSIS — M545 Low back pain: Secondary | ICD-10-CM | POA: Diagnosis not present

## 2017-02-25 DIAGNOSIS — I10 Essential (primary) hypertension: Secondary | ICD-10-CM | POA: Diagnosis not present

## 2017-02-25 DIAGNOSIS — B029 Zoster without complications: Secondary | ICD-10-CM | POA: Diagnosis not present

## 2017-04-03 DIAGNOSIS — S99922A Unspecified injury of left foot, initial encounter: Secondary | ICD-10-CM | POA: Diagnosis not present

## 2017-04-03 DIAGNOSIS — I10 Essential (primary) hypertension: Secondary | ICD-10-CM | POA: Diagnosis not present

## 2017-04-03 DIAGNOSIS — S93602A Unspecified sprain of left foot, initial encounter: Secondary | ICD-10-CM | POA: Diagnosis not present

## 2017-04-03 DIAGNOSIS — M7732 Calcaneal spur, left foot: Secondary | ICD-10-CM | POA: Diagnosis not present

## 2017-05-20 DIAGNOSIS — F4320 Adjustment disorder, unspecified: Secondary | ICD-10-CM | POA: Diagnosis not present

## 2017-06-16 ENCOUNTER — Encounter (HOSPITAL_COMMUNITY): Payer: Self-pay

## 2017-06-16 ENCOUNTER — Emergency Department (HOSPITAL_COMMUNITY): Payer: BLUE CROSS/BLUE SHIELD

## 2017-06-16 ENCOUNTER — Emergency Department (HOSPITAL_COMMUNITY)
Admission: EM | Admit: 2017-06-16 | Discharge: 2017-06-16 | Disposition: A | Discharge status: Home / Self Care | Payer: BLUE CROSS/BLUE SHIELD | Attending: Emergency Medicine | Admitting: Emergency Medicine

## 2017-06-16 DIAGNOSIS — Z79899 Other long term (current) drug therapy: Secondary | ICD-10-CM | POA: Diagnosis not present

## 2017-06-16 DIAGNOSIS — Z6832 Body mass index (BMI) 32.0-32.9, adult: Secondary | ICD-10-CM | POA: Diagnosis not present

## 2017-06-16 DIAGNOSIS — E785 Hyperlipidemia, unspecified: Secondary | ICD-10-CM | POA: Insufficient documentation

## 2017-06-16 DIAGNOSIS — G43109 Migraine with aura, not intractable, without status migrainosus: Secondary | ICD-10-CM | POA: Diagnosis not present

## 2017-06-16 DIAGNOSIS — R110 Nausea: Secondary | ICD-10-CM | POA: Diagnosis not present

## 2017-06-16 DIAGNOSIS — G43909 Migraine, unspecified, not intractable, without status migrainosus: Secondary | ICD-10-CM | POA: Diagnosis not present

## 2017-06-16 DIAGNOSIS — Z823 Family history of stroke: Secondary | ICD-10-CM | POA: Diagnosis not present

## 2017-06-16 DIAGNOSIS — R51 Headache: Secondary | ICD-10-CM | POA: Diagnosis not present

## 2017-06-16 DIAGNOSIS — I10 Essential (primary) hypertension: Secondary | ICD-10-CM | POA: Diagnosis not present

## 2017-06-16 LAB — CBC WITH DIFFERENTIAL/PLATELET
Basophils Absolute: 0 10*3/uL (ref 0.0–0.1)
Basophils Relative: 0 %
Eosinophils Absolute: 0.3 10*3/uL (ref 0.0–0.7)
Eosinophils Relative: 3 %
HCT: 43.3 % (ref 36.0–46.0)
Hemoglobin: 14.4 g/dL (ref 12.0–15.0)
Lymphocytes Relative: 22 %
Lymphs Abs: 2 10*3/uL (ref 0.7–4.0)
MCH: 29.6 pg (ref 26.0–34.0)
MCHC: 33.3 g/dL (ref 30.0–36.0)
MCV: 89.1 fL (ref 78.0–100.0)
Monocytes Absolute: 0.6 10*3/uL (ref 0.1–1.0)
Monocytes Relative: 6 %
Neutro Abs: 6 10*3/uL (ref 1.7–7.7)
Neutrophils Relative %: 69 %
Platelets: 326 10*3/uL (ref 150–400)
RBC: 4.86 MIL/uL (ref 3.87–5.11)
RDW: 13 % (ref 11.5–15.5)
WBC: 8.9 10*3/uL (ref 4.0–10.5)

## 2017-06-16 LAB — BASIC METABOLIC PANEL
Anion gap: 9 (ref 5–15)
BUN: 11 mg/dL (ref 6–20)
CO2: 27 mmol/L (ref 22–32)
Calcium: 9.5 mg/dL (ref 8.9–10.3)
Chloride: 102 mmol/L (ref 101–111)
Creatinine, Ser: 0.84 mg/dL (ref 0.44–1.00)
GFR calc Af Amer: >60 mL/min (ref >60)
GFR calc non Af Amer: >60 mL/min (ref >60)
Glucose, Bld: 84 mg/dL (ref 65–99)
Potassium: 3.7 mmol/L (ref 3.5–5.1)
Sodium: 138 mmol/L (ref 135–145)

## 2017-06-16 MED ORDER — PROCHLORPERAZINE EDISYLATE 5 MG/ML IJ SOLN
10.0000 mg | Freq: Once | INTRAMUSCULAR | Status: AC
  Administered 2017-06-16: 10 mg via INTRAVENOUS
  Filled 2017-06-16: qty 2

## 2017-06-16 MED ORDER — SODIUM CHLORIDE 0.9 % IV BOLUS (SEPSIS)
1000.0000 mL | Freq: Once | INTRAVENOUS | Status: AC
  Administered 2017-06-16: 1000 mL via INTRAVENOUS

## 2017-06-16 MED ORDER — DIPHENHYDRAMINE HCL 50 MG/ML IJ SOLN
25.0000 mg | Freq: Once | INTRAMUSCULAR | Status: AC
Start: 2017-06-16 — End: 2017-06-16
  Administered 2017-06-16: 25 mg via INTRAVENOUS
  Filled 2017-06-16: qty 1

## 2017-06-16 MED ORDER — BUTALBITAL-APAP-CAFFEINE 50-325-40 MG PO TABS: 1.0000 | ORAL_TABLET | Freq: Four times a day (QID) | ORAL | 0 refills | Status: DC | PRN

## 2017-06-16 MED ORDER — KETOROLAC TROMETHAMINE 30 MG/ML IJ SOLN
30.0000 mg | Freq: Once | INTRAMUSCULAR | Status: AC
  Administered 2017-06-16: 30 mg via INTRAVENOUS
  Filled 2017-06-16: qty 1

## 2017-06-16 NOTE — Discharge Instructions (Signed)
Return here as needed.  Follow-up with your primary doctor for a recheck of your blood pressure

## 2017-06-16 NOTE — ED Provider Notes (Signed)
[facility]-EMERGENCY DEPT Provider Note   CSN: [id number] Arrival date & time: 06/16/17  1216     History   Chief Complaint Chief Complaint  Patient presents with  . Headache  . Hypertension    HPI Tamara Frazier is a 46 y.o. female.  HPI Patient presents to the emergency department with migraine headache and elevated blood pressure.  The patient states that she has had her blood pressure under good control of the last year.  She states that every time she gets a migraine.  It seems to balloon her blood pressure.  The patient states that she took ibuprofen which usually helps her headaches, but this headache seemed worse than normal.  The patient states that nothing seems make the condition better or worse.  She states she has had some photophobia and some nauseaThe patient denies chest pain, shortness of breath, ,blurred vision, neck pain, fever, cough, weakness, numbness, dizziness, anorexia, edema, abdominal pain,  vomiting, diarrhea, rash, back pain, dysuria, hematemesis, bloody stool, near syncope, or syncope. Past Medical History:  Diagnosis Date  . Blood transfusion   . Complication of anesthesia   . Diverticulitis   . 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 Hyperlipemia   . Hypertension   . Migraine   . Pneumococcal infection    pt states she has had it 2 times   . PONV (postoperative nausea and vomiting)     Patient Active Problem List   Diagnosis Date Noted  . Chest pain 11/05/2014  . HTN (hypertension) 11/05/2014  . Hyperlipidemia 11/05/2014    Past Surgical History:  Procedure Laterality Date  . CESAREAN SECTION    . HYSTEROSCOPY N/A 06/18/2016   Procedure: DX HYSTEROSCOPY  with removal of embedded IUD;  Surgeon: Olivia Mackie, MD;  Location: [facility];  Service: Gynecology;  Laterality: N/A;  1hr.  . TUBAL LIGATION    . Tubal ligation reversal      OB  History    Gravida Para Term Preterm AB Living   6 5 5     5    SAB TAB Ectopic Multiple Live Births           1       Home Medications    Prior to Admission medications   Medication Sig Start Date End Date Taking? Authorizing Provider  amLODipine (NORVASC) 10 MG tablet Take 10 mg by mouth daily.      [provider]  b complex vitamins tablet Take 1 tablet by mouth daily.    [provider]  Cholecalciferol (VITAMIN D PO) Take 1 tablet by mouth daily.    [provider]  Coenzyme Q10 (CO Q 10) 10 MG CAPS Take 1 tablet by mouth daily.     [provider]  hydrochlorothiazide (HYDRODIURIL) 25 MG tablet Take 25 mg by mouth daily.    [provider]  ibuprofen (ADVIL,MOTRIN) 200 MG tablet Take 400 mg by mouth every 6 (six) hours as needed for headache or mild pain.     [provider]  Multiple Vitamin (MULTIVITAMIN WITH MINERALS) TABS Take 1 tablet by mouth daily.    [provider]  oxyCODONE-acetaminophen (PERCOCET/ROXICET) 5-325 MG tablet Take 1 tablet by mouth every 6 (six) hours as needed for severe pain.    [provider]    Family History Family History  Problem Relation Age of Onset  .  CAD Father 7839       CABG, died 7262  . Hypertension Mother     Social History Social History  Substance Use Topics  . Smoking status: Never Smoker  . Smokeless tobacco: Never Used  . Alcohol use Yes     Comment: occ     Allergies   Patient has no known allergies.   Review of Systems Review of Systems All other systems negative except as documented in the HPI. All pertinent positives and negatives as reviewed in the HPI.  Physical Exam Updated Vital Signs BP (!) 178/101 (BP Location: Left Arm)   Pulse 98   Temp 98.4 F (36.9 C) (Oral)   Resp 18   Ht 5\' 3"  (1.6 m)   Wt 86.2 kg (190 lb)   LMP 05/22/2017   SpO2 98%   BMI 33.66 kg/m   Physical Exam  Constitutional: She is oriented to person, place, and  time. She appears well-developed and well-nourished. No distress.  HENT:  Head: Normocephalic and atraumatic.  Mouth/Throat: Oropharynx is clear and moist.  Eyes: Pupils are equal, round, and reactive to light.  Neck: Normal range of motion. Neck supple.  Cardiovascular: Normal rate, regular rhythm and normal heart sounds.  Exam reveals no gallop and no friction rub.   No murmur heard. Pulmonary/Chest: Effort normal and breath sounds normal. No respiratory distress. She has no wheezes.  Abdominal: Soft. Bowel sounds are normal. She exhibits no distension. There is no tenderness.  Neurological: She is alert and oriented to person, place, and time. She exhibits normal muscle tone. Coordination normal.  Skin: Skin is warm and dry. Capillary refill takes less than 2 seconds. No rash noted. No erythema.  Psychiatric: She has a normal mood and affect. Her behavior is normal.  Nursing note and vitals reviewed.    ED Treatments / Results  Labs (all labs ordered are listed, but only abnormal results are displayed) Labs Reviewed  BASIC METABOLIC PANEL  CBC WITH DIFFERENTIAL/PLATELET    EKG  EKG Interpretation None       Radiology Ct Head Wo Contrast  Result Date: 06/16/2017 CLINICAL DATA:  Left headaches for 6 days EXAM: CT HEAD WITHOUT CONTRAST TECHNIQUE: Contiguous axial images were obtained from the base of the skull through the vertex without intravenous contrast. COMPARISON:  August 27, 2012 FINDINGS: Brain: No evidence of acute infarction, hemorrhage, hydrocephalus, extra-axial collection or mass lesion/mass effect. Vascular: No hyperdense vessel or unexpected calcification. Skull: Normal. Negative for fracture or focal lesion. Sinuses/Orbits: No acute finding. Other: None. IMPRESSION: No focal acute intracranial abnormality identified. Electronically Signed   By: Sherian ReinWei-Chen  Lin M.D.   On: 06/16/2017 17:02    Procedures Procedures (including critical care time)  Medications  Ordered in ED Medications  sodium chloride 0.9 % bolus 1,000 mL (1,000 mLs Intravenous New Bag/Given 06/16/17 1731)  ketorolac (TORADOL) 30 MG/ML injection 30 mg (30 mg Intravenous Given 06/16/17 1850)  prochlorperazine (COMPAZINE) injection 10 mg (10 mg Intravenous Given 06/16/17 1850)  diphenhydrAMINE (BENADRYL) injection 25 mg (25 mg Intravenous Given 06/16/17 1848)     Initial Impression / Assessment and Plan / ED Course  I have reviewed the triage vital signs and the nursing notes.  Pertinent labs & imaging results that were available during my care of the patient were reviewed by me and considered in my medical decision making (see chart for details).     Patient has a negative head CT the head CT was ordered due to the fact  the patient is having worsening headache normal with an elevated blood pressure.  The patient is advised she will need to follow-up with her primary doctor to ensure that her blood pressure has normalized after her headache is resolved.  Patient is feeling better following IV Toradol, Compazine and Benadryl along with IV fluids.  Patient is advised return here as needed and all questions were answered.  Patient agrees the plan  Final Clinical Impressions(s) / ED Diagnoses   Final diagnoses:  None    New Prescriptions New Prescriptions   No medications on file     Kyra MangesLawyer, Argyle Gustafson, PA-C 06/16/17 Daisy [id number]    Pfeiffer, Marcy, MD 06/20/17 [phone]

## 2017-06-16 NOTE — ED Triage Notes (Signed)
Per Pt, Pt is coming from PCP with complaints of migraine and HTN for one week. Pt reports posterior headache with left eye pressure with some floaters. Denies blurred vision, unilateral weakness, or aphasia. Pt is alert and oriented x4 upon arrival to the ED.

## 2017-06-21 DIAGNOSIS — R51 Headache: Secondary | ICD-10-CM | POA: Diagnosis not present

## 2017-06-21 DIAGNOSIS — I10 Essential (primary) hypertension: Secondary | ICD-10-CM | POA: Diagnosis not present

## 2017-06-30 DIAGNOSIS — F4320 Adjustment disorder, unspecified: Secondary | ICD-10-CM | POA: Diagnosis not present

## 2017-06-30 DIAGNOSIS — G43811 Other migraine, intractable, with status migrainosus: Secondary | ICD-10-CM | POA: Diagnosis not present

## 2017-06-30 DIAGNOSIS — I10 Essential (primary) hypertension: Secondary | ICD-10-CM | POA: Diagnosis not present

## 2017-06-30 DIAGNOSIS — E782 Mixed hyperlipidemia: Secondary | ICD-10-CM | POA: Diagnosis not present

## 2017-06-30 DIAGNOSIS — N912 Amenorrhea, unspecified: Secondary | ICD-10-CM | POA: Diagnosis not present

## 2017-07-01 LAB — LIPID PANEL
CHOLESTEROL: 227 — AB (ref 0–200)
HDL: 50 (ref 35–70)
LDL Cholesterol: 146
TRIGLYCERIDES: 156 (ref 40–160)

## 2017-07-01 LAB — HEPATIC FUNCTION PANEL
ALT: 19 (ref 7–35)
AST: 24 (ref 13–35)
Alkaline Phosphatase: 111 (ref 25–125)
Bilirubin, Total: 0.2

## 2017-07-01 LAB — BASIC METABOLIC PANEL
BUN: 13 (ref 4–21)
CREATININE: 0.7 (ref <=1.1)
Glucose: 76
POTASSIUM: 3.8 (ref 3.4–5.3)
Sodium: 137 (ref 137–147)

## 2017-07-07 DIAGNOSIS — F4320 Adjustment disorder, unspecified: Secondary | ICD-10-CM | POA: Diagnosis not present

## 2017-07-21 DIAGNOSIS — E669 Obesity, unspecified: Secondary | ICD-10-CM | POA: Diagnosis not present

## 2017-07-21 DIAGNOSIS — I10 Essential (primary) hypertension: Secondary | ICD-10-CM | POA: Diagnosis not present

## 2017-07-21 DIAGNOSIS — Z6831 Body mass index (BMI) 31.0-31.9, adult: Secondary | ICD-10-CM | POA: Diagnosis not present

## 2017-08-29 DIAGNOSIS — F4320 Adjustment disorder, unspecified: Secondary | ICD-10-CM | POA: Diagnosis not present

## 2017-09-06 DIAGNOSIS — N390 Urinary tract infection, site not specified: Secondary | ICD-10-CM | POA: Diagnosis not present

## 2017-09-06 DIAGNOSIS — I10 Essential (primary) hypertension: Secondary | ICD-10-CM | POA: Diagnosis not present

## 2017-09-06 DIAGNOSIS — R300 Dysuria: Secondary | ICD-10-CM | POA: Diagnosis not present

## 2017-09-20 DIAGNOSIS — F4320 Adjustment disorder, unspecified: Secondary | ICD-10-CM | POA: Diagnosis not present

## 2018-01-17 NOTE — Progress Notes (Signed)
Subjective:    Patient ID: Tamara Frazier, female    DOB: 1971/08/01, 47 y.o.   MRN: [id number]  HPI:  Tamara Frazier presents to establish as a new pt.  She is a pleasant 47 year old female. PMH: HTN, HL, DDD L3-L4, lumbar back > 1 year, Obesity, and HA (migraine and tension).  She reports in 2015 that she "had seizures type activity", however epilepsy was ruled out in favor of POTS.  She denies any neurological sx's or hypotension since BP has been well managed (was recently taken off Amlodipine 10mg  and started on Lisinopril/HCTZ 20/25mg  6-12 months ago).  Cardica work-up Dec 2015 after [facility] ED encounter- Dec 2015 CT Ca++ Score- normal She has not followed up with cards since then  She reports back pain as constant, daily occurrence, rated 6/10.  She has been using OTC NSAIDs and heating pad with only minimal sx relief.  She denies numbness/tingling in lower extremities or change in bowel/bladder dysfunction. She has never tried PT She reports taking Fioricet in the past "and it really helped my back". She is reports walking 10-12K steps/day and following a Heart Healthy Diet "two weeks of out month, when it's not around my cycle". She denies tobacco/excessive ETOH use. 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  Patient Care Team    Relationship Specialty Notifications Start End  Julaine Fusianford, Katy D, NP PCP - General Family Medicine  12/20/17     Patient Active Problem List   Diagnosis Date Noted  . Healthcare maintenance 01/19/2018  . Chronic midline low back pain without sciatica 01/19/2018  . Body mass index (BMI) of 35.0 to 35.9 01/19/2018  . Chest pain 11/05/2014  . HTN (hypertension) 11/05/2014  . Hyperlipidemia 11/05/2014     Past Medical History:  Diagnosis Date  . Blood transfusion   . Complication of anesthesia   . Diverticulitis   . Family history of adverse reaction to anesthesia 1990   pt's father pancreatitis tube was closed - birth defect, caused  problems with anesthesia  . Hyperlipemia   . Hypertension   . Migraine   . Pneumococcal infection    pt states she has had it 2 times   . PONV (postoperative nausea and vomiting)      Past Surgical History:  Procedure Laterality Date  . CESAREAN SECTION    . HYSTEROSCOPY N/A 06/18/2016   Procedure: DX HYSTEROSCOPY  with removal of embedded IUD;  Surgeon: Olivia Mackieichard Taavon, MD;  Location: [facility];  Service: Gynecology;  Laterality: N/A;  1hr.  . TUBAL LIGATION    . Tubal ligation reversal       Family History  Problem Relation Age of Onset  . CAD Father 2239       CABG, died 7762  . Heart attack Father   . Hypertension Father   . Hyperlipidemia Father   . Hypertension Mother   . Clotting disorder Mother   . Diabetes Sister   . Hypertension Sister   . Hepatitis C Sister   . COPD Sister      Social History   Substance and Sexual Activity  Drug Use No     Social History   Substance and Sexual Activity  Alcohol Use Yes   Comment: occ     Social History   Tobacco Use  Smoking Status Never Smoker  Smokeless Tobacco Never Used     Outpatient Encounter Medications as of 01/19/2018  Medication Sig  . b complex  vitamins tablet Take 1 tablet by mouth daily.  . Cholecalciferol (VITAMIN D PO) Take 1 tablet by mouth daily.  . Coenzyme Q10 (CO Q 10) 10 MG CAPS Take 1 tablet by mouth daily.   Marland Kitchen ibuprofen (ADVIL,MOTRIN) 200 MG tablet Take 400 mg by mouth every 6 (six) hours as needed for headache or mild pain.   Marland Kitchen lisinopril-hydrochlorothiazide (PRINZIDE,ZESTORETIC) 20-25 MG tablet Take 1 tablet by mouth daily.  . Multiple Vitamin (MULTIVITAMIN WITH MINERALS) TABS Take 1 tablet by mouth daily.  . [DISCONTINUED] lisinopril-hydrochlorothiazide (PRINZIDE,ZESTORETIC) 20-25 MG tablet Take 1 tablet by mouth daily.  . [DISCONTINUED] amLODipine (NORVASC) 10 MG tablet Take 10 mg by mouth daily.    . [DISCONTINUED] butalbital-acetaminophen-caffeine (FIORICET, ESGIC) 50-325-40 MG tablet  Take 1-2 tablets by mouth every 6 (six) hours as needed for headache.  . [DISCONTINUED] hydrochlorothiazide (HYDRODIURIL) 25 MG tablet Take 25 mg by mouth daily.  . [DISCONTINUED] oxyCODONE-acetaminophen (PERCOCET/ROXICET) 5-325 MG tablet Take 1 tablet by mouth every 6 (six) hours as needed for severe pain.   No facility-administered encounter medications on file as of 01/19/2018.     Allergies: Hydrocodone-acetaminophen  Body mass index is 35.61 kg/m.  Blood pressure 122/73, pulse 80, height 5\' 2"  (1.575 m), weight 194 lb 11.2 oz (88.3 kg), last menstrual period 01/18/2018, SpO2 97 %.     Review of Systems  Constitutional: Positive for fatigue. Negative for activity change, appetite change, chills, diaphoresis, fever and unexpected weight change.  Eyes: Negative for visual disturbance.  Respiratory: Negative for cough, chest tightness, shortness of breath, wheezing and stridor.   Cardiovascular: Negative for chest pain, palpitations and leg swelling.  Gastrointestinal: Negative for abdominal distention, abdominal pain, blood in stool, constipation, diarrhea, nausea and vomiting.  Endocrine: Negative for cold intolerance, heat intolerance, polydipsia, polyphagia and polyuria.  Genitourinary: Negative for difficulty urinating and flank pain.  Musculoskeletal: Positive for arthralgias, back pain and myalgias. Negative for gait problem, joint swelling, neck pain and neck stiffness.  Skin: Negative for color change, pallor, rash and wound.  Neurological: Positive for headaches. Negative for dizziness.  Hematological: Does not bruise/bleed easily.  Psychiatric/Behavioral: Negative for decreased concentration, dysphoric mood, hallucinations, self-injury, sleep disturbance and suicidal ideas. The patient is not nervous/anxious and is not hyperactive.        Objective:   Physical Exam  Constitutional: She is oriented to person, place, and time. She appears well-developed and  well-nourished. No distress.  Cardiovascular: Normal rate, regular rhythm, normal heart sounds and intact distal pulses.  No murmur heard. Pulmonary/Chest: Effort normal and breath sounds normal. No respiratory distress. She has no wheezes. She has no rales. She exhibits no tenderness.  Neurological: She is alert and oriented to person, place, and time.  Skin: Skin is warm and dry. No rash noted. She is not diaphoretic. No erythema. No pallor.  Psychiatric: She has a normal mood and affect. Her behavior is normal. Judgment and thought content normal.  Nursing note and vitals reviewed.         Assessment & Plan:   1. Hypertension, unspecified type   2. Hyperlipidemia, unspecified hyperlipidemia type   3. Family history of diabetes mellitus in sister   56. Family history of early CAD   5. Healthcare maintenance   6. Chronic midline low back pain without sciatica   7. Body mass index (BMI) of 35.0 to 35.9     HTN (hypertension) BP at goal 122/73, HR 80 Continue Lisinopril/HCTZ 20/25mg  QD Denies acute cardiac sx's  Chronic midline  low back pain without sciatica Continue daily walking and heating pad use Stretch daily Weight loss would help reduce pain Alternate OTC Acetaminophen and Ibuprofen PT referral placed   Hyperlipidemia Will obtain fasting labs  Healthcare maintenance Increase water intake, strive for at least 80 ounces/day. Follow Heart Healthy diet Continue regular exercise.  Recommend at least 30 minutes daily, 5 days per week of walking, jogging, biking, swimming, YouTube/Pinterest workout videos. PT referral placed, re: back pain Recommend alternating OTC Acetaminophen and Ibuprofen for back pain. Please schedule f/u appt, re: fasting labs and discuss medical wt loss.  Body mass index (BMI) of 35.0 to 35.9 Continue walking regime and consistently follow Heart Healthy Diet BP at goal Schedule f/u appt to discuss medical wt loss      Pt was in the office  today for 25+ minutes, with over 50% time spent in face to face counseling of patient's various medical conditions and in coordination of care  FOLLOW-UP:  Return in about 4 weeks (around 02/16/2018) for Fasting Labs, Medical Weight Loss.

## 2018-01-19 ENCOUNTER — Encounter: Payer: Self-pay | Admitting: Adult Health

## 2018-01-19 ENCOUNTER — Other Ambulatory Visit: Payer: Self-pay | Admitting: Adult Health

## 2018-01-19 ENCOUNTER — Ambulatory Visit (INDEPENDENT_AMBULATORY_CARE_PROVIDER_SITE_OTHER): Payer: No Typology Code available for payment source | Admitting: Adult Health

## 2018-01-19 VITALS — BP 122/73 | HR 80 | Ht 62.0 in | Wt 194.7 lb

## 2018-01-19 DIAGNOSIS — I10 Essential (primary) hypertension: Secondary | ICD-10-CM | POA: Diagnosis not present

## 2018-01-19 DIAGNOSIS — Z833 Family history of diabetes mellitus: Secondary | ICD-10-CM | POA: Diagnosis not present

## 2018-01-19 DIAGNOSIS — Z0000 Encounter for general adult medical examination without abnormal findings: Secondary | ICD-10-CM

## 2018-01-19 DIAGNOSIS — Z8249 Family history of ischemic heart disease and other diseases of the circulatory system: Secondary | ICD-10-CM

## 2018-01-19 DIAGNOSIS — E785 Hyperlipidemia, unspecified: Secondary | ICD-10-CM

## 2018-01-19 DIAGNOSIS — Z6835 Body mass index (BMI) 35.0-35.9, adult: Secondary | ICD-10-CM

## 2018-01-19 DIAGNOSIS — G8929 Other chronic pain: Secondary | ICD-10-CM

## 2018-01-19 DIAGNOSIS — M545 Low back pain, unspecified: Secondary | ICD-10-CM

## 2018-01-19 DIAGNOSIS — Z6831 Body mass index (BMI) 31.0-31.9, adult: Secondary | ICD-10-CM | POA: Insufficient documentation

## 2018-01-19 MED ORDER — LISINOPRIL-HYDROCHLOROTHIAZIDE 20-25 MG PO TABS: 1.0000 | ORAL_TABLET | Freq: Every day | ORAL | 2 refills | Status: DC

## 2018-01-19 MED FILL — LISINOPRIL-HCTZ 20-25 MG TA: 20-25 | 90 days supply | Qty: 90 | Fill #0

## 2018-01-19 NOTE — Assessment & Plan Note (Addendum)
Increase water intake, strive for at least 80 ounces/day. Follow Heart Healthy diet Continue regular exercise.  Recommend at least 30 minutes daily, 5 days per week of walking, jogging, biking, swimming, YouTube/Pinterest workout videos. PT referral placed, re: back pain Recommend alternating OTC Acetaminophen and Ibuprofen for back pain. Please schedule f/u appt, re: fasting labs and discuss medical wt loss.

## 2018-01-19 NOTE — Assessment & Plan Note (Signed)
BP at goal 122/73, HR 80 Continue Lisinopril/HCTZ 20/25mg  QD Denies acute cardiac sx's

## 2018-01-19 NOTE — Assessment & Plan Note (Signed)
Continue daily walking and heating pad use Stretch daily Weight loss would help reduce pain Alternate OTC Acetaminophen and Ibuprofen PT referral placed

## 2018-01-19 NOTE — Patient Instructions (Addendum)
Heart-Healthy Eating Plan Heart-healthy meal planning includes:  Limiting unhealthy fats.  Increasing healthy fats.  Making other small dietary changes.  You may need to talk with your doctor or a diet specialist (dietitian) to create an eating plan that is right for you. What types of fat should I choose?  Choose healthy fats. These include olive oil and canola oil, flaxseeds, walnuts, almonds, and seeds.  Eat more omega-3 fats. These include salmon, mackerel, sardines, tuna, flaxseed oil, and ground flaxseeds. Try to eat fish at least twice each week.  Limit saturated fats. ? Saturated fats are often found in animal products, such as meats, butter, and cream. ? Plant sources of saturated fats include palm oil, palm kernel oil, and coconut oil.  Avoid foods with partially hydrogenated oils in them. These include stick margarine, some tub margarines, cookies, crackers, and other baked goods. These contain trans fats. What general guidelines do I need to follow?  Check food labels carefully. Identify foods with trans fats or high amounts of saturated fat.  Fill one half of your plate with vegetables and green salads. Eat 4-5 servings of vegetables per day. A serving of vegetables is: ? 1 cup of raw leafy vegetables. ?  cup of raw or cooked cut-up vegetables. ?  cup of vegetable juice.  Fill one fourth of your plate with whole grains. Look for the word "whole" as the first word in the ingredient list.  Fill one fourth of your plate with lean protein foods.  Eat 4-5 servings of fruit per day. A serving of fruit is: ? One medium whole fruit. ?  cup of dried fruit. ?  cup of fresh, frozen, or canned fruit. ?  cup of 100% fruit juice.  Eat more foods that contain soluble fiber. These include apples, broccoli, carrots, beans, peas, and barley. Try to get 20-30 g of fiber per day.  Eat more home-cooked food. Eat less restaurant, buffet, and fast food.  Limit or avoid  alcohol.  Limit foods high in starch and sugar.  Avoid fried foods.  Avoid frying your food. Try baking, boiling, grilling, or broiling it instead. You can also reduce fat by: ? Removing the skin from poultry. ? Removing all visible fats from meats. ? Skimming the fat off of stews, soups, and gravies before serving them. ? Steaming vegetables in water or broth.  Lose weight if you are overweight.  Eat 4-5 servings of nuts, legumes, and seeds per week: ? One serving of dried beans or legumes equals  cup after being cooked. ? One serving of nuts equals 1 ounces. ? One serving of seeds equals  ounce or one tablespoon.  You may need to keep track of how much salt or sodium you eat. This is especially true if you have high blood pressure. Talk with your doctor or dietitian to get more information. What foods can I eat? Grains Breads, including French, white, pita, wheat, raisin, rye, oatmeal, and Italian. Tortillas that are neither fried nor made with lard or trans fat. Low-fat rolls, including hotdog and hamburger buns and English muffins. Biscuits. Muffins. Waffles. Pancakes. Light popcorn. Whole-grain cereals. Flatbread. Melba toast. Pretzels. Breadsticks. Rusks. Low-fat snacks. Low-fat crackers, including oyster, saltine, matzo, graham, animal, and rye. Rice and pasta, including brown rice and pastas that are made with whole wheat. Vegetables All vegetables. Fruits All fruits, but limit coconut. Meats and Other Protein Sources Lean, well-trimmed beef, veal, pork, and lamb. Chicken and turkey without skin. All fish and shellfish.   Wild duck, rabbit, pheasant, and venison. Egg whites or low-cholesterol egg substitutes. Dried beans, peas, lentils, and tofu. Seeds and most nuts. Dairy Low-fat or nonfat cheeses, including ricotta, string, and mozzarella. Skim or 1% milk that is liquid, powdered, or evaporated. Buttermilk that is made with low-fat milk. Nonfat or low-fat  yogurt. Beverages Mineral water. Diet carbonated beverages. Sweets and Desserts Sherbets and fruit ices. Honey, jam, marmalade, jelly, and syrups. Meringues and gelatins. Pure sugar candy, such as hard candy, jelly beans, gumdrops, mints, marshmallows, and small amounts of dark chocolate. [employer] food cake. Eat all sweets and desserts in moderation. Fats and Oils Nonhydrogenated (trans-free) margarines. Vegetable oils, including soybean, sesame, sunflower, olive, peanut, safflower, corn, canola, and cottonseed. Salad dressings or mayonnaise made with a vegetable oil. Limit added fats and oils that you use for cooking, baking, salads, and as spreads. Other Cocoa powder. Coffee and tea. All seasonings and condiments. The items listed above may not be a complete list of recommended foods or beverages. Contact your dietitian for more options. What foods are not recommended? Grains Breads that are made with saturated or trans fats, oils, or whole milk. Croissants. Butter rolls. Cheese breads. Sweet rolls. Donuts. Buttered popcorn. Chow mein noodles. High-fat crackers, such as cheese or butter crackers. Meats and Other Protein Sources Fatty meats, such as hotdogs, short ribs, sausage, spareribs, bacon, rib eye roast or steak, and mutton. High-fat deli meats, such as salami and bologna. Caviar. Domestic duck and goose. Organ meats, such as kidney, liver, sweetbreads, and heart. Dairy Cream, sour cream, cream cheese, and creamed cottage cheese. Whole-milk cheeses, including blue (bleu), [address], [place], [place], [address], [place], [address], cheddar, [place], and [place]. Whole or 2% milk that is liquid, evaporated, or condensed. Whole buttermilk. Cream sauce or high-fat cheese sauce. Yogurt that is made from whole milk. Beverages Regular sodas and juice drinks with added sugar. Sweets and Desserts Frosting. Pudding. Cookies. Cakes other than angel food cake. Candy that has milk chocolate or white  chocolate, hydrogenated fat, butter, coconut, or unknown ingredients. Buttered syrups. Full-fat ice cream or ice cream drinks. Fats and Oils Gravy that has suet, meat fat, or shortening. Cocoa butter, hydrogenated oils, palm oil, coconut oil, palm kernel oil. These can often be found in baked products, candy, fried foods, nondairy creamers, and whipped toppings. Solid fats and shortenings, including bacon fat, salt pork, lard, and butter. Nondairy cream substitutes, such as coffee creamers and sour cream substitutes. Salad dressings that are made of unknown oils, cheese, or sour cream. The items listed above may not be a complete list of foods and beverages to avoid. Contact your dietitian for more information. This information is not intended to replace advice given to you by your health care provider. Make sure you discuss any questions you have with your health care provider. Document Released: 05/09/2012 Document Revised: 04/15/2016 Document Reviewed: 05/02/2014 Elsevier Interactive Patient Education  2018 [employer]   Increase water intake, strive for at least 80 ounces/day.   Follow Heart Healthy diet Continue regular exercise.  Recommend at least 30 minutes daily, 5 days per week of walking, jogging, biking, swimming, YouTube/Pinterest workout videos. PT referral placed, re: back pain Recommend alternating OTC Acetaminophen and Ibuprofen for back pain. Please schedule f/u appt, re: fasting labs and discuss medical wt loss. WELCOME TO THE PRACTICE!

## 2018-01-19 NOTE — Assessment & Plan Note (Signed)
Will obtain fasting labs  

## 2018-01-19 NOTE — Assessment & Plan Note (Signed)
Continue walking regime and consistently follow Heart Healthy Diet BP at goal Schedule f/u appt to discuss medical wt loss

## 2018-01-26 NOTE — Progress Notes (Signed)
Subjective:    Patient ID: Tamara Frazier, female    DOB: 1970/12/12, 47 y.o.   MRN: [id number]  HPI:  01/19/18 OV: [name] presents to establish as a new pt.  She is a pleasant 47 year old female. PMH: HTN, HL, DDD L3-L4, lumbar back > 1 year, Obesity, and HA (migraine and tension).  She reports in 2015 that she "had seizures type activity", however epilepsy was ruled out in favor of POTS.  She denies any neurological sx's or hypotension since BP has been well managed (was recently taken off Amlodipine 10mg  and started on Lisinopril/HCTZ 20/25mg  6-12 months ago).  Cardica work-up Dec 2015 after CP ED encounter- Dec 2015 CT Ca++ Score- normal She has not followed up with cards since then  She reports back pain as constant, daily occurrence, rated 6/10.  She has been using OTC NSAIDs and heating pad with only minimal sx relief.  She denies numbness/tingling in lower extremities or change in bowel/bladder dysfunction. She has never tried PT She reports taking Fioricet in the past "and it really helped my back". She is reports walking 10-12K steps/day and following a Heart Healthy Diet "two weeks of out month, when it's not around my cycle". She denies tobacco/excessive ETOH use. 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  01/30/18 OV: [name] is here for fasting labs and to discuss medical wt loss.  She again reports healthy eating and regular walking for two weeks/month, "but falls off the wagon around my cycle". She denies tobacco use She denies CP/dsypnea/palpiations She would really like to get down to 160 lbs and "just feel like myself again"    Patient Care Team    Relationship Specialty Notifications Start End  Julaine Fusianford, Katy D, NP PCP - General Family Medicine  12/20/17   [facility], [facility]    01/19/18   [facility]  01/19/18   [facility], [facility]  [facility]  01/19/18   [facility], [facility]   Obstetrics and Gynecology  01/19/18     Patient Active Problem List   Diagnosis Date Noted  . Healthcare maintenance 01/19/2018  . Chronic midline low back pain without sciatica 01/19/2018  . Body mass index (BMI) of 35.0 to 35.9 01/19/2018  . Chest pain 11/05/2014  . HTN (hypertension) 11/05/2014  . Hyperlipidemia 11/05/2014     Past Medical History:  Diagnosis Date  . Blood transfusion   . Complication of anesthesia   . Diverticulitis   . 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 Hyperlipemia   . Hypertension   . Migraine   . Pneumococcal infection    pt states she has had it 2 times   . PONV (postoperative nausea and vomiting)      Past Surgical History:  Procedure Laterality Date  . CESAREAN SECTION    . HYSTEROSCOPY N/A 06/18/2016   Procedure: DX HYSTEROSCOPY  with removal of embedded IUD;  Surgeon: Olivia Mackieichard Taavon, MD;  Location: [facility];  Service: Gynecology;  Laterality: N/A;  1hr.  . TUBAL LIGATION    . Tubal ligation reversal       Family History  Problem Relation Age of Onset  . CAD Father 2439       CABG, died 5662  . Heart attack Father   . Hypertension Father   . Hyperlipidemia Father   . Hypertension  Mother   . Clotting disorder Mother   . Diabetes Sister   . Hypertension Sister   . Hepatitis C Sister   . COPD Sister      Social History   Substance and Sexual Activity  Drug Use No     Social History   Substance and Sexual Activity  Alcohol Use Yes   Comment: occ     Social History   Tobacco Use  Smoking Status Never Smoker  Smokeless Tobacco Never Used     Outpatient Encounter Medications as of 01/30/2018  Medication Sig  . b complex vitamins tablet Take 1 tablet by mouth daily.  . Cholecalciferol (VITAMIN D PO) Take 1 tablet by mouth daily.  . Coenzyme Q10 (CO Q 10) 10 MG CAPS Take 1 tablet by mouth daily.   Marland Kitchen ibuprofen  (ADVIL,MOTRIN) 200 MG tablet Take 400 mg by mouth every 6 (six) hours as needed for headache or mild pain.   Marland Kitchen lisinopril-hydrochlorothiazide (PRINZIDE,ZESTORETIC) 20-25 MG tablet Take 1 tablet by mouth daily.  . Multiple Vitamin (MULTIVITAMIN WITH MINERALS) TABS Take 1 tablet by mouth daily.  . Phentermine-Topiramate 3.75-23 MG CP24 Take 1 capsule by mouth 1 day or 1 dose for 1 dose.  Melene Muller ON 02/13/2018] Phentermine-Topiramate 7.5-46 MG CP24 Take 1 capsule by mouth 1 day or 1 dose for 1 dose. Do not start this dosage until you have completed 14 days of 3.75/23mg  strength   No facility-administered encounter medications on file as of 01/30/2018.     Allergies: Hydrocodone-acetaminophen  Body mass index is 35.75 kg/m.  Blood pressure 123/82, pulse 68, height 5' 2.5" (1.588 m), weight 198 lb 9.6 oz (90.1 kg), last menstrual period 01/18/2018, SpO2 95 %.     Review of Systems  Constitutional: Positive for fatigue. Negative for activity change, appetite change, chills, diaphoresis, fever and unexpected weight change.  Eyes: Negative for visual disturbance.  Respiratory: Negative for cough, chest tightness, shortness of breath, wheezing and stridor.   Cardiovascular: Negative for chest pain, palpitations and leg swelling.  Gastrointestinal: Negative for abdominal distention, abdominal pain, blood in stool, constipation, diarrhea, nausea and vomiting.  Endocrine: Negative for cold intolerance, heat intolerance, polydipsia, polyphagia and polyuria.  Genitourinary: Negative for difficulty urinating and flank pain.  Musculoskeletal: Positive for arthralgias, back pain and myalgias. Negative for gait problem, joint swelling, neck pain and neck stiffness.  Skin: Negative for color change, pallor, rash and wound.  Neurological: Positive for headaches. Negative for dizziness.  Hematological: Does not bruise/bleed easily.  Psychiatric/Behavioral: Negative for decreased concentration, dysphoric  mood, hallucinations, self-injury, sleep disturbance and suicidal ideas. The patient is not nervous/anxious and is not hyperactive.        Objective:   Physical Exam  Constitutional: She is oriented to person, place, and time. She appears well-developed and well-nourished. No distress.  Cardiovascular: Normal rate, regular rhythm, normal heart sounds and intact distal pulses.  No murmur heard. Pulmonary/Chest: Effort normal and breath sounds normal. No respiratory distress. She has no wheezes. She has no rales. She exhibits no tenderness.  Neurological: She is alert and oriented to person, place, and time.  Skin: Skin is warm and dry. No rash noted. She is not diaphoretic. No erythema. No pallor.  Psychiatric: She has a normal mood and affect. Her behavior is normal. Judgment and thought content normal.  Nursing note and vitals reviewed.         Assessment & Plan:   1. Body mass index (BMI) of 35.0 to 35.9  2. Hypertension, unspecified type   3. Hyperlipidemia, unspecified hyperlipidemia type   4. Healthcare maintenance     HTN (hypertension) BP at goal 123/82, HR 68 She denies tobacco use She denies acute cardiac sx's  Body mass index (BMI) of 35.0 to 35.9 [place] Controlled Substance Database reviewed- no contraindications noted. With her HA hx and BMI- 35.75 Started on Phentermine 3.75/23mg  QD for two weeks Then titrate up to 7.5/46mg  QD Continue to walk daily and follow heart healthy diet Continue to increase water intake Follow-up in 4 weeks    FOLLOW-UP:  Return in about 4 weeks (around 02/27/2018) for Medical Weight Loss, Evaluate Medication Effectiveness.

## 2018-01-30 ENCOUNTER — Encounter: Payer: Self-pay | Admitting: Adult Health

## 2018-01-30 ENCOUNTER — Ambulatory Visit (INDEPENDENT_AMBULATORY_CARE_PROVIDER_SITE_OTHER): Payer: No Typology Code available for payment source | Admitting: Adult Health

## 2018-01-30 VITALS — BP 123/82 | HR 68 | Ht 62.5 in | Wt 198.6 lb

## 2018-01-30 DIAGNOSIS — Z6835 Body mass index (BMI) 35.0-35.9, adult: Secondary | ICD-10-CM

## 2018-01-30 DIAGNOSIS — I10 Essential (primary) hypertension: Secondary | ICD-10-CM | POA: Diagnosis not present

## 2018-01-30 DIAGNOSIS — E785 Hyperlipidemia, unspecified: Secondary | ICD-10-CM

## 2018-01-30 DIAGNOSIS — Z0000 Encounter for general adult medical examination without abnormal findings: Secondary | ICD-10-CM | POA: Diagnosis not present

## 2018-01-30 MED ORDER — PHENTERMINE-TOPIRAMATE ER 3.75-23 MG PO CP24: 1.0000 | ORAL_CAPSULE | ORAL | 0 refills | Status: DC

## 2018-01-30 MED ORDER — PHENTERMINE-TOPIRAMATE ER 7.5-46 MG PO CP24: 1.0000 | ORAL_CAPSULE | ORAL | 0 refills | Status: DC

## 2018-01-30 NOTE — Patient Instructions (Signed)
Exercising to Lose Weight Exercising can help you to lose weight. In order to lose weight through exercise, you need to do vigorous-intensity exercise. You can tell that you are exercising with vigorous intensity if you are breathing very hard and fast and cannot hold a conversation while exercising. Moderate-intensity exercise helps to maintain your current weight. You can tell that you are exercising at a moderate level if you have a higher heart rate and faster breathing, but you are still able to hold a conversation. How often should I exercise? Choose an activity that you enjoy and set realistic goals. Your health care provider can help you to make an activity plan that works for you. Exercise regularly as directed by your health care provider. This may include:  Doing resistance training twice each week, such as: ? Push-ups. ? Sit-ups. ? Lifting weights. ? Using resistance bands.  Doing a given intensity of exercise for a given amount of time. Choose from these options: ? 150 minutes of moderate-intensity exercise every week. ? 75 minutes of vigorous-intensity exercise every week. ? A mix of moderate-intensity and vigorous-intensity exercise every week.  Children, pregnant women, people who are out of shape, people who are overweight, and older adults may need to consult a health care provider for individual recommendations. If you have any sort of medical condition, be sure to consult your health care provider before starting a new exercise program. What are some activities that can help me to lose weight?  Walking at a rate of at least 4.5 miles an hour.  Jogging or running at a rate of 5 miles per hour.  Biking at a rate of at least 10 miles per hour.  Lap swimming.  Roller-skating or in-line skating.  Cross-country skiing.  Vigorous competitive sports, such as football, basketball, and soccer.  Jumping rope.  Aerobic dancing. How can I be more active in my day-to-day  activities?  Use the stairs instead of the elevator.  Take a walk during your lunch break.  If you drive, park your car farther away from work or school.  If you take public transportation, get off one stop early and walk the rest of the way.  Make all of your phone calls while standing up and walking around.  Get up, stretch, and walk around every 30 minutes throughout the day. What guidelines should I follow while exercising?  Do not exercise so much that you hurt yourself, feel dizzy, or get very short of breath.  Consult your health care provider prior to starting a new exercise program.  Wear comfortable clothes and shoes with good support.  Drink plenty of water while you exercise to prevent dehydration or heat stroke. Body water is lost during exercise and must be replaced.  Work out until you breathe faster and your heart beats faster. This information is not intended to replace advice given to you by your health care provider. Make sure you discuss any questions you have with your health care provider. Document Released: 12/11/2010 Document Revised: 04/15/2016 Document Reviewed: 04/11/2014 Elsevier Interactive Patient Education  2018 [employer]  Phentermine; Topiramate extended-release capsules What is this medicine? Phentermine; topiramate (FEN ter meen; toe PYRE a mate) is a combination of two medicines used with a reduced calorie diet and exercise to help you lose weight. This medicine is only available through certified pharmacies enrolled in a special program. Your healthcare professional will tell you where you can get your medicine. If you have additional questions, you can visit  the [employer] website at [email or web address] or contact them by phone at [phone]. This medicine may be used for other purposes; ask your health care provider or pharmacist if you have questions. COMMON BRAND NAME(S): Qsymia What should I tell my health care provider before I  take this medicine? They need to know if you have any of these conditions: -agitation -diarrhea -depression or other mental illness -diabetes -glaucoma -heart disease -high or low blood pressure -history of anorexia or other eating disorder -history of substance abuse -kidney stones or kidney disease -liver disease -lung disease like asthma, obstructive pulmonary disease, emphysema -metabolic acidosis -on a ketogenic diet -scheduled for surgery or a procedure -suicidal thoughts, plans, or attempt; a previous suicide attempt by you or a family member -taken an MAOI like Carbex, Eldepryl, Marplan, Nardil, or Parnate in last 14 days -thyroid disease -an unusual or allergic reaction to phentermine, topiramate, other medicines, foods, dyes, or preservatives -pregnant or trying to get pregnant -breast-feeding How should I use this medicine? Take this medicine by mouth with a glass of water. Follow the directions on the prescription label. Do not crush or chew. This medicine is usually taken with or without food once per day in the morning. Avoid taking this medicine in the evening. It may interfere with sleep. Take your doses at regular intervals. Do not take your medicine more often than directed. A special MedGuide will be given to you by the pharmacist with each prescription and refill. Be sure to read this information carefully each time. Talk to your pediatrician regarding the use of this medicine in children. Special care may be needed. Overdosage: If you think you have taken too much of this medicine contact a poison control center or emergency room at once. NOTE: This medicine is only for you. Do not share this medicine with others. What if I miss a dose? If you miss a dose, take it as soon as you can. If it is almost time for your next dose, take only that dose. Do not take double or extra doses. What may interact with this medicine? Do not take this medicine with any of the  following medications: -MAOIs like Carbex, Eldepryl, Marplan, Nardil, and Parnate This medicine may also interact with the following medications: -acetazolamide -amitriptyline -antihistamines for allergy, cough and cold -atropine -birth control pills -carbamazepine -certain medicines for bladder problems like oxybutynin, tolterodine -certain medicines for depression, anxiety, or psychotic disturbances -certain medicines for Parkinson's disease like benztropine, trihexyphenidyl -certain medicines for stomach problems like dicyclomine, hyoscyamine -certain medicines for travel sickness like scopolamine -dichlorphenamide -digoxin -diltiazem -diuretics -hydrochlorothiazide -ipratropium -lithium -medicines for diabetes -medicines for pain, sleep, or muscle relaxation -methazolamide -phenytoin -pioglitazone -stimulant medicines for attention disorders, weight loss, or to stay awake -valproic acid -zonisamide This list may not describe all possible interactions. Give your health care provider a list of all the medicines, herbs, non-prescription drugs, or dietary supplements you use. Also tell them if you smoke, drink alcohol, or use illegal drugs. Some items may interact with your medicine. What should I watch for while using this medicine? Visit your doctor or health care professional for regular checks on your progress. This medicine is intended to be used in addition to a healthy diet and appropriate exercise. The best results are achieved this way. Do not increase or in any way change your dose without consulting your doctor or health care professional. Do not take this medicine within 6 hours of bedtime. It can keep you from getting to sleep.  Avoid drinks that contain caffeine and try to stick to a regular bedtime every night. Do not stop taking this medicine suddenly. This increases the risk of seizures. This medicine can decrease sweating and increase your body temperature. Watch  for signs of deceased sweating or fever. Avoid extreme heat, hot baths, and saunas. Be careful about exercising, especially in hot weather. Contact your health care provider right away if you notice a fever or decrease in sweating. You should drink plenty of fluids while taking this medicine. If you have had kidney stones in the past, this will help to reduce your chances of forming kidney stones. If you have stomach pain, with nausea or vomiting and yellowing of your eyes or skin, call your doctor immediately. You may get drowsy or dizzy. Do not drive, use machinery, or do anything that needs mental alertness until you know how this medicine affects you. Do not stand or sit up quickly, especially if you are an older patient. This reduces the risk of dizzy or fainting spells. Alcohol may increase dizziness and drowsiness. Avoid alcoholic drinks. This medicine may affect blood sugar levels. If you have diabetes, check with your doctor or health care professional before you change your diet or the dose of your diabetic medicine. Patients and their families should watch out for worsening depression or thoughts of suicide. Also watch out for sudden changes in feelings such as feeling anxious, agitated, panicky, irritable, hostile, aggressive, impulsive, severely restless, overly excited and hyperactive, or not being able to sleep. If this happens, especially at the beginning of treatment or after a change in dose, call your health care professional. If you notice blurred vision, eye pain, or other eye problems, seek medical attention at once for an eye exam. This medicine may increase the chance of developing metabolic acidosis. If left untreated, this can cause kidney stones, bone disease, or slowed growth in children. Symptoms include breathing fast, fatigue, loss of appetite, irregular heartbeat, or loss of consciousness. Call your doctor immediately if you experience any of these side effects. Also, tell your  doctor about any surgery you plan on having while taking this medicine since this may increase your risk for metabolic acidosis. Women who become pregnant while using this medicine should contact their physician immediately. You should also contact The Qsymia Pregnancy Surveillance Program which is a program that monitors pregnancies that occur during treatment. Contact the program by calling [phone]. What side effects may I notice from receiving this medicine? Side effects that you should report to your doctor or health care professional as soon as possible: -allergic reactions like skin rash, itching or hives, swelling of the face, lips, or tongue -blood in the urine -changes in vision -chest pain or chest tightness -confusion -depressed mood -difficulty breathing -dizziness -fast or irregular heartbeat -feeling anxious -irritable -loss of appetite -low blood pressure -pain in the lower back or side -pain, tingling, numbness in the hands or feet -pain when urinating -palpitations -redness, blistering, peeling or loosening of the skin, including inside the mouth -shortness of breath -suicidal thoughts or other mood changes -trouble passing urine or change in the amount of urine -trouble walking, dizziness, loss of balance or coordination -unusually weak or tired -vomiting Side effects that usually do not require medical attention (report to your doctor or health care professional if they continue or are bothersome): -change in sex drive or performance -changes in vision -constipation -diarrhea -dry mouth -headache -nausea -tremors -trouble sleeping -upset stomach This list may not describe  all possible side effects. Call your doctor for medical advice about side effects. You may report side effects to FDA at 1-800-FDA-1088. Where should I keep my medicine? Keep out of the reach of children. This medicine can be abused. Keep your medicine in a safe place to protect it  from theft. Do not share this medicine with anyone. Selling or giving away this medicine is dangerous and against the law. This medicine may cause accidental overdose and death if taken by other adults, children, or pets. Mix any unused medicine with a substance like cat litter or coffee grounds. Then throw the medicine away in a sealed container like a sealed bag or a coffee can with a lid. Do not use the medicine after the expiration date. Store at room temperature between 15 and 25 degrees C (59 and 77 degrees F). NOTE: This sheet is a summary. It may not cover all possible information. If you have questions about this medicine, talk to your doctor, pharmacist, or health care provider.  2018 Elsevier/Gold Standard (2015-12-11 14:30:46)   With your headache history and BMI- 35.75 Started on Phentermine 3.75/23mg  once daily for two weeks. Then titrate up to 7.5/46mg  once daily on 02/13/18 Continue to walk daily and follow heart healthy diet Continue to increase water intake We will call you when lab results are available. Follow-up in 4 weeks NICE TO SEE YOU!

## 2018-01-30 NOTE — Assessment & Plan Note (Signed)
[place] Controlled Substance Database reviewed- no contraindications noted. With her HA hx and BMI- 35.75 Started on Phentermine 3.75/23mg  QD for two weeks Then titrate up to 7.5/46mg  QD Continue to walk daily and follow heart healthy diet Continue to increase water intake Follow-up in 4 weeks

## 2018-01-30 NOTE — Assessment & Plan Note (Signed)
BP at goal 123/82, HR 68 She denies tobacco use She denies acute cardiac sx's

## 2018-01-31 LAB — CBC WITH DIFFERENTIAL/PLATELET
BASOS ABS: 0 10*3/uL (ref 0.0–0.2)
Basos: 0 %
EOS (ABSOLUTE): 0.3 10*3/uL (ref 0.0–0.4)
Eos: 5 %
HEMATOCRIT: 38.5 % (ref 34.0–46.6)
HEMOGLOBIN: 13.1 g/dL (ref 11.1–15.9)
Immature Grans (Abs): 0 10*3/uL (ref 0.0–0.1)
Immature Granulocytes: 0 %
LYMPHS ABS: 1.4 10*3/uL (ref 0.7–3.1)
Lymphs: 21 %
MCH: 30 pg (ref 26.6–33.0)
MCHC: 34 g/dL (ref 31.5–35.7)
MCV: 88 fL (ref 79–97)
MONOCYTES: 7 %
Monocytes Absolute: 0.5 10*3/uL (ref 0.1–0.9)
NEUTROS ABS: 4.5 10*3/uL (ref 1.4–7.0)
Neutrophils: 67 %
Platelets: 351 10*3/uL (ref 150–379)
RBC: 4.37 x10E6/uL (ref 3.77–5.28)
RDW: 13.2 % (ref 12.3–15.4)
WBC: 6.7 10*3/uL (ref 3.4–10.8)

## 2018-01-31 LAB — HEMOGLOBIN A1C
Est. average glucose Bld gHb Est-mCnc: 105 mg/dL
HEMOGLOBIN A1C: 5.3 % (ref 4.8–5.6)

## 2018-01-31 LAB — COMPREHENSIVE METABOLIC PANEL
A/G RATIO: 1.7 (ref 1.2–2.2)
ALBUMIN: 4 g/dL (ref 3.5–5.5)
ALK PHOS: 101 IU/L (ref 39–117)
ALT: 13 IU/L (ref 0–32)
AST: 13 IU/L (ref 0–40)
BUN / CREAT RATIO: 22 (ref 9–23)
BUN: 17 mg/dL (ref 6–24)
CHLORIDE: 103 mmol/L (ref 96–106)
CO2: 23 mmol/L (ref 20–29)
Calcium: 9.3 mg/dL (ref 8.7–10.2)
Creatinine, Ser: 0.77 mg/dL (ref 0.57–1.00)
GFR calc Af Amer: 108 mL/min/{1.73_m2} (ref >59)
GFR calc non Af Amer: 94 mL/min/{1.73_m2} (ref >59)
GLOBULIN, TOTAL: 2.4 g/dL (ref 1.5–4.5)
GLUCOSE: 91 mg/dL (ref 65–99)
POTASSIUM: 4 mmol/L (ref 3.5–5.2)
SODIUM: 139 mmol/L (ref 134–144)
Total Protein: 6.4 g/dL (ref 6.0–8.5)

## 2018-01-31 LAB — TSH: TSH: 3.33 u[IU]/mL (ref 0.450–4.500)

## 2018-01-31 LAB — LIPID PANEL
CHOLESTEROL TOTAL: 237 mg/dL — AB (ref 100–199)
Chol/HDL Ratio: 5.2 ratio — ABNORMAL HIGH (ref 0.0–4.4)
HDL: 46 mg/dL (ref >39)
LDL Calculated: 172 mg/dL — ABNORMAL HIGH (ref 0–99)
Triglycerides: 97 mg/dL (ref 0–149)
VLDL CHOLESTEROL CAL: 19 mg/dL (ref 5–40)

## 2018-02-01 ENCOUNTER — Telehealth: Payer: Self-pay | Admitting: Adult Health

## 2018-02-01 NOTE — Telephone Encounter (Signed)
Patient called requested [facility] [address] [facility].  --glh

## 2018-02-01 NOTE — Telephone Encounter (Signed)
LVM for pt to call to discuss reason for referral request.  Tiajuana Amass. Nelson, CMA

## 2018-02-02 NOTE — Telephone Encounter (Signed)
LVM for pt to call to discuss reason for referral request.  T. Nelson, CMA 

## 2018-02-08 NOTE — Telephone Encounter (Signed)
Pt's VM box is full and cannot accept messages.  Since pt has not returned previous messages, will close encounter per protocol and re-address should pt call back.  Tiajuana Amass. Nelson, CMA

## 2018-02-23 ENCOUNTER — Ambulatory Visit: Payer: No Typology Code available for payment source | Admitting: Adult Health

## 2018-02-27 NOTE — Progress Notes (Deleted)
Subjective:    Patient ID: Tamara Frazier, female    DOB: 1971/10/09, 47 y.o.   MRN: [id number]  HPI:  01/19/18 OV: [name] presents to establish as a new pt.  She is a pleasant 47 year old female. PMH: HTN, HL, DDD L3-L4, lumbar back > 1 year, Obesity, and HA (migraine and tension).  She reports in 2015 that she "had seizures type activity", however epilepsy was ruled out in favor of POTS.  She denies any neurological sx's or hypotension since BP has been well managed (was recently taken off Amlodipine 10mg  and started on Lisinopril/HCTZ 20/25mg  6-12 months ago).  Cardica work-up Dec 2015 after CP ED encounter- Dec 2015 CT Ca++ Score- normal She has not followed up with cards since then  She reports back pain as constant, daily occurrence, rated 6/10.  She has been using OTC NSAIDs and heating pad with only minimal sx relief.  She denies numbness/tingling in lower extremities or change in bowel/bladder dysfunction. She has never tried PT She reports taking Fioricet in the past "and it really helped my back". She is reports walking 10-12K steps/day and following a Heart Healthy Diet "two weeks of out month, when it's not around my cycle". She denies tobacco/excessive ETOH use. 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  01/30/18 OV: [name] is here for fasting labs and to discuss medical wt loss.  She again reports healthy eating and regular walking for two weeks/month, "but falls off the wagon around my cycle". She denies tobacco use She denies CP/dsypnea/palpiations She would really like to get down to 160 lbs and "just feel like myself again"   02/28/18 OV: [name] presents for medical wt loss  Patient Care Team    Relationship Specialty Notifications Start End  Julaine Fusi, NP PCP - General Family Medicine  12/20/17   Gastroenterology, [facility]    01/19/18   Neurology, [facility]  Neurology  01/19/18   Cardiology, [facility]   Cardiology  01/19/18   Ob/Gyn, [facility]  Obstetrics and Gynecology  01/19/18     Patient Active Problem List   Diagnosis Date Noted  . Healthcare maintenance 01/19/2018  . Chronic midline low back pain without sciatica 01/19/2018  . Body mass index (BMI) of 35.0 to 35.9 01/19/2018  . Chest pain 11/05/2014  . HTN (hypertension) 11/05/2014  . Hyperlipidemia 11/05/2014     Past Medical History:  Diagnosis Date  . Blood transfusion   . Complication of anesthesia   . Diverticulitis   . 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 Hyperlipemia   . Hypertension   . Migraine   . Pneumococcal infection    pt states she has had it 2 times   . PONV (postoperative nausea and vomiting)      Past Surgical History:  Procedure Laterality Date  . CESAREAN SECTION    . HYSTEROSCOPY N/A 06/18/2016   Procedure: DX HYSTEROSCOPY  with removal of embedded IUD;  Surgeon: Olivia Mackie, MD;  Location: [facility];  Service: Gynecology;  Laterality: N/A;  1hr.  . TUBAL LIGATION    . Tubal ligation reversal       Family History  Problem Relation Age of Onset  . CAD Father 59       CABG, died 69  . Heart attack Father   . Hypertension Father   .  Hyperlipidemia Father   . Hypertension Mother   . Clotting disorder Mother   . Diabetes Sister   . Hypertension Sister   . Hepatitis C Sister   . COPD Sister      Social History   Substance and Sexual Activity  Drug Use No     Social History   Substance and Sexual Activity  Alcohol Use Yes   Comment: occ     Social History   Tobacco Use  Smoking Status Never Smoker  Smokeless Tobacco Never Used     Outpatient Encounter Medications as of 02/28/2018  Medication Sig  . b complex vitamins tablet Take 1 tablet by mouth daily.  . Cholecalciferol (VITAMIN D PO) Take 1 tablet by mouth daily.  . Coenzyme Q10 (CO Q 10) 10 MG CAPS  Take 1 tablet by mouth daily.   Marland Kitchen. ibuprofen (ADVIL,MOTRIN) 200 MG tablet Take 400 mg by mouth every 6 (six) hours as needed for headache or mild pain.   Marland Kitchen. lisinopril-hydrochlorothiazide (PRINZIDE,ZESTORETIC) 20-25 MG tablet Take 1 tablet by mouth daily.  . Multiple Vitamin (MULTIVITAMIN WITH MINERALS) TABS Take 1 tablet by mouth daily.  . Phentermine-Topiramate 3.75-23 MG CP24 Take 1 capsule by mouth 1 day or 1 dose for 1 dose.  Marland Kitchen. Phentermine-Topiramate 7.5-46 MG CP24 Take 1 capsule by mouth 1 day or 1 dose for 1 dose. Do not start this dosage until you have completed 14 days of 3.75/23mg  strength   No facility-administered encounter medications on file as of 02/28/2018.     Allergies: Hydrocodone-acetaminophen  There is no height or weight on file to calculate BMI.  There were no vitals taken for this visit.     Review of Systems  Constitutional: Positive for fatigue. Negative for activity change, appetite change, chills, diaphoresis, fever and unexpected weight change.  Eyes: Negative for visual disturbance.  Respiratory: Negative for cough, chest tightness, shortness of breath, wheezing and stridor.   Cardiovascular: Negative for chest pain, palpitations and leg swelling.  Gastrointestinal: Negative for abdominal distention, abdominal pain, blood in stool, constipation, diarrhea, nausea and vomiting.  Endocrine: Negative for cold intolerance, heat intolerance, polydipsia, polyphagia and polyuria.  Genitourinary: Negative for difficulty urinating and flank pain.  Musculoskeletal: Positive for arthralgias, back pain and myalgias. Negative for gait problem, joint swelling, neck pain and neck stiffness.  Skin: Negative for color change, pallor, rash and wound.  Neurological: Positive for headaches. Negative for dizziness.  Hematological: Does not bruise/bleed easily.  Psychiatric/Behavioral: Negative for decreased concentration, dysphoric mood, hallucinations, self-injury, sleep  disturbance and suicidal ideas. The patient is not nervous/anxious and is not hyperactive.        Objective:   Physical Exam  Constitutional: She is oriented to person, place, and time. She appears well-developed and well-nourished. No distress.  Cardiovascular: Normal rate, regular rhythm, normal heart sounds and intact distal pulses.  No murmur heard. Pulmonary/Chest: Effort normal and breath sounds normal. No respiratory distress. She has no wheezes. She has no rales. She exhibits no tenderness.  Neurological: She is alert and oriented to person, place, and time.  Skin: Skin is warm and dry. No rash noted. She is not diaphoretic. No erythema. No pallor.  Psychiatric: She has a normal mood and affect. Her behavior is normal. Judgment and thought content normal.  Nursing note and vitals reviewed.         Assessment & Plan:   No diagnosis found.  No problem-specific Assessment & Plan notes found for this encounter.   FOLLOW-UP:  No follow-ups on file.

## 2018-02-28 ENCOUNTER — Ambulatory Visit: Payer: No Typology Code available for payment source | Admitting: Adult Health

## 2018-06-12 MED FILL — LISINOPRIL-HCTZ 20-25 MG TA: 20-25 | 90 days supply | Qty: 90 | Fill #1

## 2018-09-13 ENCOUNTER — Encounter: Payer: Self-pay | Admitting: Adult Health

## 2018-09-13 ENCOUNTER — Ambulatory Visit (INDEPENDENT_AMBULATORY_CARE_PROVIDER_SITE_OTHER): Payer: BLUE CROSS/BLUE SHIELD | Admitting: Adult Health

## 2018-09-13 VITALS — BP 139/79 | HR 89 | Ht 62.5 in | Wt 203.4 lb

## 2018-09-13 DIAGNOSIS — Z6836 Body mass index (BMI) 36.0-36.9, adult: Secondary | ICD-10-CM | POA: Diagnosis not present

## 2018-09-13 DIAGNOSIS — I10 Essential (primary) hypertension: Secondary | ICD-10-CM | POA: Diagnosis not present

## 2018-09-13 MED ORDER — NALTREXONE-BUPROPION HCL ER 8-90 MG PO TB12: ORAL_TABLET | ORAL | 0 refills | Status: DC

## 2018-09-13 NOTE — Assessment & Plan Note (Signed)
Did not experience any wt loss on phentermine topirmate  Current wt 203 BMI 36 Mediterranean diet, increase regular exercise To treat obesity and GAD/depression Contrave Taper started F/u 4 weeks

## 2018-09-13 NOTE — Patient Instructions (Addendum)
Mediterranean Diet A Mediterranean diet refers to food and lifestyle choices that are based on the traditions of countries located on the [place]. This way of eating has been shown to help prevent certain conditions and improve outcomes for people who have chronic diseases, like kidney disease and heart disease. What are tips for following this plan? Lifestyle  Cook and eat meals together with your family, when possible.  Drink enough fluid to keep your urine clear or pale yellow.  Be physically active every day. This includes: ? Aerobic exercise like running or swimming. ? Leisure activities like gardening, walking, or housework.  Get 7-8 hours of sleep each night.  If recommended by your health care provider, drink red wine in moderation. This means 1 glass a day for nonpregnant women and 2 glasses a day for men. A glass of wine equals 5 oz (150 mL). Reading food labels  Check the serving size of packaged foods. For foods such as rice and pasta, the serving size refers to the amount of cooked product, not dry.  Check the total fat in packaged foods. Avoid foods that have saturated fat or trans fats.  Check the ingredients list for added sugars, such as corn syrup. Shopping  At the grocery store, buy most of your food from the areas near the walls of the store. This includes: ? Fresh fruits and vegetables (produce). ? Grains, beans, nuts, and seeds. Some of these may be available in unpackaged forms or large amounts (in bulk). ? Fresh seafood. ? Poultry and eggs. ? Low-fat dairy products.  Buy whole ingredients instead of prepackaged foods.  Buy fresh fruits and vegetables in-season from local farmers markets.  Buy frozen fruits and vegetables in resealable bags.  If you do not have access to quality fresh seafood, buy precooked frozen shrimp or canned fish, such as tuna, salmon, or sardines.  Buy small amounts of raw or cooked vegetables, salads, or olives from the  deli or salad bar at your store.  Stock your pantry so you always have certain foods on hand, such as olive oil, canned tuna, canned tomatoes, rice, pasta, and beans. Cooking  Cook foods with extra-virgin olive oil instead of using butter or other vegetable oils.  Have meat as a side dish, and have vegetables or grains as your main dish. This means having meat in small portions or adding small amounts of meat to foods like pasta or stew.  Use beans or vegetables instead of meat in common dishes like chili or lasagna.  Experiment with different cooking methods. Try roasting or broiling vegetables instead of steaming or sauteing them.  Add frozen vegetables to soups, stews, pasta, or rice.  Add nuts or seeds for added healthy fat at each meal. You can add these to yogurt, salads, or vegetable dishes.  Marinate fish or vegetables using olive oil, lemon juice, garlic, and fresh herbs. Meal planning  Plan to eat 1 vegetarian meal one day each week. Try to work up to 2 vegetarian meals, if possible.  Eat seafood 2 or more times a week.  Have healthy snacks readily available, such as: ? Vegetable sticks with hummus. ? Greek yogurt. ? Fruit and nut trail mix.  Eat balanced meals throughout the week. This includes: ? Fruit: 2-3 servings a day ? Vegetables: 4-5 servings a day ? Low-fat dairy: 2 servings a day ? Fish, poultry, or lean meat: 1 serving a day ? Beans and legumes: 2 or more servings a week ? Nuts   and seeds: 1-2 servings a day ? Whole grains: 6-8 servings a day ? Extra-virgin olive oil: 3-4 servings a day  Limit red meat and sweets to only a few servings a month What are my food choices?  Mediterranean diet ? Recommended ? Grains: Whole-grain pasta. Brown rice. Bulgar wheat. Polenta. Couscous. Whole-wheat bread. [place]. ? Vegetables: Artichokes. Beets. Broccoli. Cabbage. Carrots. Eggplant. Green beans. Chard. Kale. Spinach. Onions. Leeks. Peas. Squash.  Tomatoes. Peppers. Radishes. ? Fruits: Apples. Apricots. Avocado. Berries. Bananas. Cherries. Dates. Figs. Grapes. Lemons. Melon. Oranges. Peaches. Plums. Pomegranate. ? Meats and other protein foods: Beans. Almonds. Sunflower seeds. Pine nuts. Peanuts. [place]. Salmon. Scallops. Shrimp. [place]. Tilapia. Clams. Oysters. Eggs. ? Dairy: Low-fat milk. Cheese. Greek yogurt. ? Beverages: Water. Red wine. Herbal tea. ? Fats and oils: Extra virgin olive oil. Avocado oil. Grape seed oil. ? Sweets and desserts: [place] yogurt with honey. Baked apples. Poached pears. Trail mix. ? Seasoning and other foods: Basil. Cilantro. Coriander. Cumin. Mint. Parsley. Sage. Rosemary. Tarragon. Garlic. Oregano. Thyme. Pepper. Balsalmic vinegar. Tahini. Hummus. Tomato sauce. Olives. Mushrooms. ? Limit these ? Grains: Prepackaged pasta or rice dishes. Prepackaged cereal with added sugar. ? Vegetables: Deep fried potatoes (french fries). ? Fruits: Fruit canned in syrup. ? Meats and other protein foods: Beef. Pork. Lamb. Poultry with skin. Hot dogs. Berniece Salines. ? Dairy: Ice cream. Sour cream. Whole milk. ? Beverages: Juice. Sugar-sweetened soft drinks. Beer. Liquor and spirits. ? Fats and oils: Butter. Canola oil. Vegetable oil. Beef fat (tallow). Lard. ? Sweets and desserts: Cookies. Cakes. Pies. Candy. ? Seasoning and other foods: Mayonnaise. Premade sauces and marinades. ? The items listed may not be a complete list. Talk with your dietitian about what dietary choices are right for you. Summary  The Mediterranean diet includes both food and lifestyle choices.  Eat a variety of fresh fruits and vegetables, beans, nuts, seeds, and whole grains.  Limit the amount of red meat and sweets that you eat.  Talk with your health care provider about whether it is safe for you to drink red wine in moderation. This means 1 glass a day for nonpregnant women and 2 glasses a day for men. A glass of wine equals 5 oz (150 mL). This information  is not intended to replace advice given to you by your health care provider. Make sure you discuss any questions you have with your health care provider. Document Released: 07/01/2016 Document Revised: 08/03/2016 Document Reviewed: 07/01/2016 Elsevier Interactive Patient Education  2018 [employer].   Generalized Anxiety Disorder, Adult Generalized anxiety disorder (GAD) is a mental health disorder. People with this condition constantly worry about everyday events. Unlike normal anxiety, worry related to GAD is not triggered by a specific event. These worries also do not fade or get better with time. GAD interferes with life functions, including relationships, work, and school. GAD can vary from mild to severe. People with severe GAD can have intense waves of anxiety with physical symptoms (panic attacks). What are the causes? The exact cause of GAD is not known. What increases the risk? This condition is more likely to develop in:  Women.  People who have a family history of anxiety disorders.  People who are very shy.  People who experience very stressful life events, such as the death of a loved one.  People who have a very stressful family environment.  What are the signs or symptoms? People with GAD often worry excessively about many things in their lives, such as their health  and family. They may also be overly concerned about:  Doing well at work.  Being on time.  Natural disasters.  Friendships.  Physical symptoms of GAD include:  Fatigue.  Muscle tension or having muscle twitches.  Trembling or feeling shaky.  Being easily startled.  Feeling like your heart is pounding or racing.  Feeling out of breath or like you cannot take a deep breath.  Having trouble falling asleep or staying asleep.  Sweating.  Nausea, diarrhea, or irritable bowel syndrome (IBS).  Headaches.  Trouble concentrating or remembering facts.  Restlessness.  Irritability.  How  is this diagnosed? Your health care provider can diagnose GAD based on your symptoms and medical history. You will also have a physical exam. The health care provider will ask specific questions about your symptoms, including how severe they are, when they started, and if they come and go. Your health care provider may ask you about your use of alcohol or drugs, including prescription medicines. Your health care provider may refer you to a mental health specialist for further evaluation. Your health care provider will do a thorough examination and may perform additional tests to rule out other possible causes of your symptoms. To be diagnosed with GAD, a person must have anxiety that:  Is out of his or her control.  Affects several different aspects of his or her life, such as work and relationships.  Causes distress that makes him or her unable to take part in normal activities.  Includes at least three physical symptoms of GAD, such as restlessness, fatigue, trouble concentrating, irritability, muscle tension, or sleep problems.  Before your health care provider can confirm a diagnosis of GAD, these symptoms must be present more days than they are not, and they must last for six months or longer. How is this treated? The following therapies are usually used to treat GAD:  Medicine. Antidepressant medicine is usually prescribed for long-term daily control. Antianxiety medicines may be added in severe cases, especially when panic attacks occur.  Talk therapy (psychotherapy). Certain types of talk therapy can be helpful in treating GAD by providing support, education, and guidance. Options include: ? Cognitive behavioral therapy (CBT). People learn coping skills and techniques to ease their anxiety. They learn to identify unrealistic or negative thoughts and behaviors and to replace them with positive ones. ? Acceptance and commitment therapy (ACT). This treatment teaches people how to be mindful  as a way to cope with unwanted thoughts and feelings. ? Biofeedback. This process trains you to manage your body's response (physiological response) through breathing techniques and relaxation methods. You will work with a therapist while machines are used to monitor your physical symptoms.  Stress management techniques. These include yoga, meditation, and exercise.  A mental health specialist can help determine which treatment is best for you. Some people see improvement with one type of therapy. However, other people require a combination of therapies. Follow these instructions at home:  Take over-the-counter and prescription medicines only as told by your health care provider.  Try to maintain a normal routine.  Try to anticipate stressful situations and allow extra time to manage them.  Practice any stress management or self-calming techniques as taught by your health care provider.  Do not punish yourself for setbacks or for not making progress.  Try to recognize your accomplishments, even if they are small.  Keep all follow-up visits as told by your health care provider. This is important. Contact a health care provider if:  Your  symptoms do not get better.  Your symptoms get worse.  You have signs of depression, such as: ? A persistently sad, cranky, or irritable mood. ? Loss of enjoyment in activities that used to bring you joy. ? Change in weight or eating. ? Changes in sleeping habits. ? Avoiding friends or family members. ? Loss of energy for normal tasks. ? Feelings of guilt or worthlessness. Get help right away if:  You have serious thoughts about hurting yourself or others. If you ever feel like you may hurt yourself or others, or have thoughts about taking your own life, get help right away. You can go to your nearest emergency department or call:  Your local emergency services (911 in the [place]  A suicide crisis helpline, such as the National Suicide  Prevention Lifeline at 6293115331. This is open 24 hours a day.  Summary  Generalized anxiety disorder (GAD) is a mental health disorder that involves worry that is not triggered by a specific event.  People with GAD often worry excessively about many things in their lives, such as their health and family.  GAD may cause physical symptoms such as restlessness, trouble concentrating, sleep problems, frequent sweating, nausea, diarrhea, headaches, and trembling or muscle twitching.  A mental health specialist can help determine which treatment is best for you. Some people see improvement with one type of therapy. However, other people require a combination of therapies. This information is not intended to replace advice given to you by your health care provider. Make sure you discuss any questions you have with your health care provider. Document Released: 03/05/2013 Document Revised: 09/28/2016 Document Reviewed: 09/28/2016 Elsevier Interactive Patient Education  2018 [employer].   Exercising to [employer] Exercising can help you to lose weight. In order to lose weight through exercise, you need to do vigorous-intensity exercise. You can tell that you are exercising with vigorous intensity if you are breathing very hard and fast and cannot hold a conversation while exercising. Moderate-intensity exercise helps to maintain your current weight. You can tell that you are exercising at a moderate level if you have a higher heart rate and faster breathing, but you are still able to hold a conversation. How often should I exercise? Choose an activity that you enjoy and set realistic goals. Your health care provider can help you to make an activity plan that works for you. Exercise regularly as directed by your health care provider. This may include:  Doing resistance training twice each week, such as: ? Push-ups. ? Sit-ups. ? Lifting weights. ? Using resistance bands.  Doing a given  intensity of exercise for a given amount of time. Choose from these options: ? 150 minutes of moderate-intensity exercise every week. ? 75 minutes of vigorous-intensity exercise every week. ? A mix of moderate-intensity and vigorous-intensity exercise every week.  Children, pregnant women, people who are out of shape, people who are overweight, and older adults may need to consult a health care provider for individual recommendations. If you have any sort of medical condition, be sure to consult your health care provider before starting a new exercise program. What are some activities that can help me to lose weight?  Walking at a rate of at least 4.5 miles an hour.  Jogging or running at a rate of 5 miles per hour.  Biking at a rate of at least 10 miles per hour.  Lap swimming.  Roller-skating or in-line skating.  Cross-country skiing.  Vigorous competitive sports, such as football,  basketball, and soccer.  Jumping rope.  Aerobic dancing. How can I be more active in my day-to-day activities?  Use the stairs instead of the elevator.  Take a walk during your lunch break.  If you drive, park your car farther away from work or school.  If you take public transportation, get off one stop early and walk the rest of the way.  Make all of your phone calls while standing up and walking around.  Get up, stretch, and walk around every 30 minutes throughout the day. What guidelines should I follow while exercising?  Do not exercise so much that you hurt yourself, feel dizzy, or get very short of breath.  Consult your health care provider prior to starting a new exercise program.  Wear comfortable clothes and shoes with good support.  Drink plenty of water while you exercise to prevent dehydration or heat stroke. Body water is lost during exercise and must be replaced.  Work out until you breathe faster and your heart beats faster. This information is not intended to replace  advice given to you by your health care provider. Make sure you discuss any questions you have with your health care provider. Document Released: 12/11/2010 Document Revised: 04/15/2016 Document Reviewed: 04/11/2014 Elsevier Interactive Patient Education  2018 [employer].  Follow Taper dosage on Naltrexone/Bupropion Rx. Increase water intake, strive for at least 80 ounces/day.   Follow Mediterranean diet Increase regular exercise.  Recommend at least 30 minutes daily, 5 days per week of walking, jogging, biking, swimming, YouTube/Pinterest workout videos. Follow-up 4 weeks. NICE TO SEE YOU!

## 2018-09-13 NOTE — Progress Notes (Signed)
Subjective:    Patient ID: Tamara Frazier, female    DOB: 30-Sep-1971, 47 y.o.   MRN: [id number]  HPI:  Tamara Frazier presents with increased anxiety/depression and continued struggle with obesity. Increased irritability and "edginess" and increase in depression. She works a hectic schedule of two jobs and 5 children She intermittently exercises- walking She sporadically follows heart healthy diet, then "will fall off the wagon" She continues to abstain from tobacco/vape use She denies thoughts of harming herself/others She also reports struggling with concentration/focus  Of note- she did not experience any weight loss on Phentermine/Topiramate and stopped medication  Patient Care Team    Relationship Specialty Notifications Start End  Julaine Fusi, NP PCP - General Family Medicine  12/20/17   Gastroenterology, [facility]    01/19/18   Neurology, [facility]  Neurology  01/19/18   Cardiology, [facility]  Cardiology  01/19/18   Ob/Gyn, [facility]  Obstetrics and Gynecology  01/19/18     Patient Active Problem List   Diagnosis Date Noted  . Healthcare maintenance 01/19/2018  . Chronic midline low back pain without sciatica 01/19/2018  . Body mass index (BMI) of 35.0 to 35.9 01/19/2018  . Chest pain 11/05/2014  . HTN (hypertension) 11/05/2014  . Hyperlipidemia 11/05/2014     Past Medical History:  Diagnosis Date  . Blood transfusion   . Complication of anesthesia   . Diverticulitis   . 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 Hyperlipemia   . Hypertension   . Migraine   . Pneumococcal infection    pt states she has had it 2 times   . PONV (postoperative nausea and vomiting)      Past Surgical History:  Procedure Laterality Date  . CESAREAN SECTION    . HYSTEROSCOPY N/A 06/18/2016   Procedure: DX HYSTEROSCOPY  with removal of embedded  IUD;  Surgeon: Olivia Mackie, MD;  Location: [facility];  Service: Gynecology;  Laterality: N/A;  1hr.  . TUBAL LIGATION    . Tubal ligation reversal       Family History  Problem Relation Age of Onset  . CAD Father 81       CABG, died 42  . Heart attack Father   . Hypertension Father   . Hyperlipidemia Father   . Hypertension Mother   . Clotting disorder Mother   . Diabetes Sister   . Hypertension Sister   . Hepatitis C Sister   . COPD Sister      Social History   Substance and Sexual Activity  Drug Use No     Social History   Substance and Sexual Activity  Alcohol Use Yes   Comment: occ     Social History   Tobacco Use  Smoking Status Never Smoker  Smokeless Tobacco Never Used     Outpatient Encounter Medications as of 09/13/2018  Medication Sig  . b complex vitamins tablet Take 1 tablet by mouth daily.  . Cholecalciferol (VITAMIN D PO) Take 1 tablet by mouth daily.  . Coenzyme Q10 (CO Q 10) 10 MG CAPS Take 1 tablet by mouth daily.   Marland Kitchen ibuprofen (ADVIL,MOTRIN) 200 MG tablet Take 400 mg by mouth every 6 (six) hours as needed for headache or mild pain.   Marland Kitchen lisinopril-hydrochlorothiazide (PRINZIDE,ZESTORETIC) 20-25 MG tablet Take 1 tablet by mouth daily.  . Multiple Vitamin (MULTIVITAMIN WITH MINERALS) TABS  Take 1 tablet by mouth daily.  . Naltrexone-buPROPion HCl ER 8-90 MG TB12 1 tab daily for week one. 1 tab twice daily for week two. 2 tabs with breakfast 1 tab with dinner week three. 2 tabs twice daily week four- hold at this dose  . [DISCONTINUED] Phentermine-Topiramate 3.75-23 MG CP24 Take 1 capsule by mouth 1 day or 1 dose for 1 dose.  . [DISCONTINUED] Phentermine-Topiramate 7.5-46 MG CP24 Take 1 capsule by mouth 1 day or 1 dose for 1 dose. Do not start this dosage until you have completed 14 days of 3.75/23mg  strength   No facility-administered encounter medications on file as of 09/13/2018.     Allergies: Hydrocodone-acetaminophen  Body mass index  is 36.61 kg/m.  Blood pressure 139/79, pulse (!) 109, height 5' 2.5" (1.588 m), weight 203 lb 6.4 oz (92.3 kg), last menstrual period 08/29/2018, SpO2 97 %.  Review of Systems  Constitutional: Positive for fatigue. Negative for activity change, appetite change, chills, diaphoresis, fever and unexpected weight change.  HENT: Negative for congestion.   Eyes: Negative for visual disturbance.  Respiratory: Negative for cough, chest tightness, wheezing and stridor.   Cardiovascular: Negative for chest pain, palpitations and leg swelling.  Skin: Negative for color change, pallor, rash and wound.  Neurological: Negative for dizziness and headaches.  Hematological: Does not bruise/bleed easily.  Psychiatric/Behavioral: Positive for decreased concentration, dysphoric mood and sleep disturbance. Negative for agitation, behavioral problems, confusion, hallucinations, self-injury and suicidal ideas. The patient is nervous/anxious and is hyperactive.        Objective:   Physical Exam  Constitutional: She is oriented to person, place, and time. She appears well-developed and well-nourished. No distress.  HENT:  Head: Normocephalic and atraumatic.  Right Ear: External ear normal.  Left Ear: External ear normal.  Nose: Nose normal.  Mouth/Throat: Oropharynx is clear and moist.  Cardiovascular: Normal rate, regular rhythm, normal heart sounds and intact distal pulses.  No murmur heard. Pulmonary/Chest: Effort normal and breath sounds normal. No stridor. No respiratory distress. She has no wheezes. She has no rales. She exhibits no tenderness.  Neurological: She is alert and oriented to person, place, and time.  Skin: Skin is warm and dry. Capillary refill takes less than 2 seconds. No rash noted. She is not diaphoretic. No erythema. No pallor.  Psychiatric: She has a normal mood and affect. Her behavior is normal. Judgment and thought content normal.  Nursing note and vitals reviewed.       Assessment & Plan:   1. Hypertension, unspecified type   2. BMI 36.0-36.9,adult     HTN (hypertension) BP 139/79, HR 89 Continue Prinzide 20/25mg  QD  BMI 36.0-36.9,adult Did not experience any wt loss on phentermine topirmate  Current wt 203 BMI 36 Mediterranean diet, increase regular exercise To treat obesity and GAD/depression Contrave Taper started F/u 4 weeks   Pt was in the office today for 25+ minutes, I spent of 75% of time in face to face counseling of patient's various medical conditions and in coordination of care FOLLOW-UP:  Return in about 4 weeks (around 10/11/2018) for Medical Weight Loss, Evaluate Medication Effectiveness.

## 2018-09-13 NOTE — Assessment & Plan Note (Signed)
BP 139/79, HR 89 Continue Prinzide 20/25mg  QD

## 2018-09-14 ENCOUNTER — Telehealth: Payer: Self-pay | Admitting: Adult Health

## 2018-09-14 ENCOUNTER — Other Ambulatory Visit: Payer: Self-pay | Admitting: Adult Health

## 2018-09-14 MED ORDER — BUPROPION HCL ER (SR) 150 MG PO TB12: ORAL_TABLET | ORAL | 0 refills | Status: DC

## 2018-09-14 NOTE — Telephone Encounter (Signed)
Pt called to check on Rx refill request for: buPROPion (WELLBUTRIN SR) 150 MG 12 hr tablet [[id number]]   Order Details  Dose, Route, Frequency: As Directed   Dispense Quantity: 30 tablet Refills: 0 Fills remaining: --        Sig: Take one tab daily for 3 days, then increase to one tab twice daily       Written Date: 09/14/18 Expiration Date: 09/14/19    Start Date: 09/14/18 End Date: --         Ordering Provider: -- DEA #:  -- NPI:  --   Authorizing Provider: Julaine Fusi, NP DEA #:  [id number] NPI:  [id number]   Ordering User:  Julaine Fusi, NP            Pharmacy:  [facility] - [place], [place] - [address] [place] DEA #:  --     ---Per patient Rx should have been sent to:  [facility] - [place], [place] - [address] [phone] (Phone) [phone] (Fax)   --Forwarding message to medical assistant.---Pls call patient when correction have been made.  -glh

## 2018-09-14 NOTE — Telephone Encounter (Signed)
MyChart message sent to pt informing her of new RX sent to [address]  Tiajuana Amass, CMA

## 2018-09-17 ENCOUNTER — Inpatient Hospital Stay (HOSPITAL_COMMUNITY)
Admission: AD | Admit: 2018-09-17 | Discharge: 2018-09-17 | Disposition: A | Discharge status: Home / Self Care | Payer: BLUE CROSS/BLUE SHIELD | Source: Ambulatory Visit | Attending: Obstetrics & Gynecology | Admitting: Obstetrics & Gynecology

## 2018-09-17 ENCOUNTER — Encounter (HOSPITAL_COMMUNITY): Payer: Self-pay | Admitting: *Deleted

## 2018-09-17 ENCOUNTER — Other Ambulatory Visit: Payer: Self-pay

## 2018-09-17 DIAGNOSIS — Z791 Long term (current) use of non-steroidal anti-inflammatories (NSAID): Secondary | ICD-10-CM | POA: Insufficient documentation

## 2018-09-17 DIAGNOSIS — Z885 Allergy status to narcotic agent status: Secondary | ICD-10-CM | POA: Insufficient documentation

## 2018-09-17 DIAGNOSIS — Z79899 Other long term (current) drug therapy: Secondary | ICD-10-CM | POA: Diagnosis not present

## 2018-09-17 DIAGNOSIS — R1030 Lower abdominal pain, unspecified: Secondary | ICD-10-CM

## 2018-09-17 DIAGNOSIS — I10 Essential (primary) hypertension: Secondary | ICD-10-CM | POA: Insufficient documentation

## 2018-09-17 DIAGNOSIS — E785 Hyperlipidemia, unspecified: Secondary | ICD-10-CM | POA: Diagnosis not present

## 2018-09-17 DIAGNOSIS — Z8249 Family history of ischemic heart disease and other diseases of the circulatory system: Secondary | ICD-10-CM | POA: Diagnosis not present

## 2018-09-17 DIAGNOSIS — R102 Pelvic and perineal pain: Secondary | ICD-10-CM | POA: Diagnosis not present

## 2018-09-17 LAB — CBC
HCT: 41.2 % (ref 36.0–46.0)
HEMOGLOBIN: 14 g/dL (ref 12.0–15.0)
MCH: 30.6 pg (ref 26.0–34.0)
MCHC: 34 g/dL (ref 30.0–36.0)
MCV: 90 fL (ref 80.0–100.0)
Platelets: 283 10*3/uL (ref 150–400)
RBC: 4.58 MIL/uL (ref 3.87–5.11)
RDW: 13.5 % (ref 11.5–15.5)
WBC: 11.5 10*3/uL — ABNORMAL HIGH (ref 4.0–10.5)
nRBC: 0 % (ref 0.0–0.2)

## 2018-09-17 LAB — WET PREP, GENITAL
CLUE CELLS WET PREP: NONE SEEN
Sperm: NONE SEEN
TRICH WET PREP: NONE SEEN
WBC, Wet Prep HPF POC: NONE SEEN
Yeast Wet Prep HPF POC: NONE SEEN

## 2018-09-17 LAB — URINALYSIS, ROUTINE W REFLEX MICROSCOPIC
BILIRUBIN URINE: NEGATIVE
Glucose, UA: NEGATIVE mg/dL
Hgb urine dipstick: NEGATIVE
KETONES UR: NEGATIVE mg/dL
LEUKOCYTES UA: NEGATIVE
NITRITE: NEGATIVE
PROTEIN: NEGATIVE mg/dL
Specific Gravity, Urine: 1.013 (ref 1.005–1.030)
pH: 8 (ref 5.0–8.0)

## 2018-09-17 LAB — POCT PREGNANCY, URINE: PREG TEST UR: NEGATIVE

## 2018-09-17 MED ORDER — KETOROLAC TROMETHAMINE 30 MG/ML IJ SOLN
60.0000 mg | Freq: Once | INTRAMUSCULAR | Status: AC
  Administered 2018-09-17: 60 mg via INTRAMUSCULAR
  Filled 2018-09-17: qty 2

## 2018-09-17 NOTE — MAU Note (Signed)
Pt woke up at 0200 in extreme pelvic pain, has been hurting ever since.  Has been having irregular periods, intermittent spotting, no bleeding this a.m.  C/O nausea, no vomiting, diarrhea or fever.

## 2018-09-17 NOTE — Discharge Instructions (Signed)

## 2018-09-17 NOTE — MAU Provider Note (Signed)
History     CSN: [id number]  Arrival date and time: 09/17/18 4098   First Provider Initiated Contact with Patient 09/17/18 0813      Chief Complaint  Patient presents with  . Pelvic Pain   47 y.o. female here with pelvic pain. Reports pain started around 2am. Describes as constant and cramping in bilateral pelvis. Rates 7/10. Has not used analgesic. No aggravating or allieviating factors. Denies urinary sx. No GI sx except nausea and she thinks this is d/t pain. No vaginal discharge, itching, or odor. No new partner. LMP was 2 weeks ago. She is using spermicide inserts for contraception.   Past Medical History:  Diagnosis Date  . Blood transfusion   . Complication of anesthesia   . Diverticulitis   . 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 Hyperlipemia   . Hypertension   . Migraine   . Pneumococcal infection    pt states she has had it 2 times   . PONV (postoperative nausea and vomiting)     Past Surgical History:  Procedure Laterality Date  . CESAREAN SECTION    . HYSTEROSCOPY N/A 06/18/2016   Procedure: DX HYSTEROSCOPY  with removal of embedded IUD;  Surgeon: Olivia Mackie, MD;  Location: [facility];  Service: Gynecology;  Laterality: N/A;  1hr.  . IUD REMOVAL  2017  . TUBAL LIGATION    . Tubal ligation reversal      Family History  Problem Relation Age of Onset  . CAD Father 22       CABG, died 13  . Heart attack Father   . Hypertension Father   . Hyperlipidemia Father   . Hypertension Mother   . Clotting disorder Mother   . Diabetes Sister   . Hypertension Sister   . Hepatitis C Sister   . COPD Sister     Social History   Tobacco Use  . Smoking status: Never Smoker  . Smokeless tobacco: Never Used  Substance Use Topics  . Alcohol use: Yes    Comment: occ  . Drug use: No    Allergies:  Allergies  Allergen Reactions  .  Hydrocodone-Acetaminophen     Nausea vomiting    Medications Prior to Admission  Medication Sig Dispense Refill Last Dose  . buPROPion (WELLBUTRIN SR) 150 MG 12 hr tablet Take one tab daily for 3 days, then increase to one tab twice daily 30 tablet 0 09/16/2018 at Unknown time  . lisinopril-hydrochlorothiazide (PRINZIDE,ZESTORETIC) 20-25 MG tablet Take 1 tablet by mouth daily. 90 tablet 2 09/16/2018 at Unknown time  . b complex vitamins tablet Take 1 tablet by mouth daily.   Taking  . Cholecalciferol (VITAMIN D PO) Take 1 tablet by mouth daily.   Taking  . Coenzyme Q10 (CO Q 10) 10 MG CAPS Take 1 tablet by mouth daily.    Taking  . ibuprofen (ADVIL,MOTRIN) 200 MG tablet Take 400 mg by mouth every 6 (six) hours as needed for headache or mild pain.    Taking  . Multiple Vitamin (MULTIVITAMIN WITH MINERALS) TABS Take 1 tablet by mouth daily.   Taking  . Naltrexone-buPROPion HCl ER 8-90 MG TB12 1 tab daily for week one. 1 tab twice daily for week two. 2 tabs with breakfast 1 tab with dinner week three. 2 tabs twice daily week four- hold at this dose 120 tablet 0 Unknown at Unknown time  Review of Systems  Constitutional: Negative for fever.  Gastrointestinal: Positive for nausea. Negative for abdominal pain, constipation, diarrhea and vomiting.  Genitourinary: Positive for pelvic pain. Negative for dysuria, frequency, hematuria, vaginal bleeding and vaginal discharge.   Physical Exam   Blood pressure (!) 145/77, pulse 92, temperature 97.6 F (36.4 C), temperature source Oral, resp. rate 20, height 5\' 3"  (1.6 m), weight 91.2 kg.  Physical Exam  Nursing note and vitals reviewed. Constitutional: She is oriented to person, place, and time. She appears well-developed and well-nourished. No distress.  HENT:  Head: Normocephalic and atraumatic.  Neck: Normal range of motion.  Respiratory: Effort normal. No respiratory distress.  GI: Soft. She exhibits no distension and no mass. There is  tenderness in the suprapubic area. There is no rebound and no guarding.  Genitourinary:  Genitourinary Comments: External: no lesions or erythema Vagina: rugated, pink, moist, thin clear discharge Uterus: non enlarged, anteverted, non tender, no CMT Adnexae: no masses, no tenderness left, no tenderness right Cervix normal   Musculoskeletal: Normal range of motion.  Neurological: She is alert and oriented to person, place, and time.  Skin: Skin is warm.  Psychiatric: She has a normal mood and affect.   Results for orders placed or performed during the hospital encounter of 09/17/18 (from the past 24 hour(s))  Urinalysis, Routine w reflex microscopic     Status: None   Collection Time: 09/17/18  8:18 AM  Result Value Ref Range   Color, Urine YELLOW YELLOW   APPearance CLEAR CLEAR   Specific Gravity, Urine 1.013 1.005 - 1.030   pH 8.0 5.0 - 8.0   Glucose, UA NEGATIVE NEGATIVE mg/dL   Hgb urine dipstick NEGATIVE NEGATIVE   Bilirubin Urine NEGATIVE NEGATIVE   Ketones, ur NEGATIVE NEGATIVE mg/dL   Protein, ur NEGATIVE NEGATIVE mg/dL   Nitrite NEGATIVE NEGATIVE   Leukocytes, UA NEGATIVE NEGATIVE  Pregnancy, urine POC     Status: None   Collection Time: 09/17/18  8:23 AM  Result Value Ref Range   Preg Test, Ur NEGATIVE NEGATIVE  CBC     Status: Abnormal   Collection Time: 09/17/18  8:44 AM  Result Value Ref Range   WBC 11.5 (H) 4.0 - 10.5 K/uL   RBC 4.58 3.87 - 5.11 MIL/uL   Hemoglobin 14.0 12.0 - 15.0 g/dL   HCT 16.1 09.6 - 04.5 %   MCV 90.0 80.0 - 100.0 fL   MCH 30.6 26.0 - 34.0 pg   MCHC 34.0 30.0 - 36.0 g/dL   RDW 40.9 81.1 - 91.4 %   Platelets 283 150 - 400 K/uL   nRBC 0.0 0.0 - 0.2 %   MAU Course  Procedures Toradol  MDM Labs ordered and reviewed. Pain improved after Toradol, pt able to fall asleep. No evidence of acute abdominal or pelvic process. Pt initially reported pelvic pain, but tenderness was found in abd, pelvic exam was unremarkable. Unlikely GYN etiology.  Hx of recurrent UTI, UA normal but has SP tenderness, will send UC. Stable for discharge home.   Assessment and Plan   1. Lower abdominal pain    Discharge home Follow up with PCP as needed Return to ED for persistent sx Ibuprofen prn  Allergies as of 09/17/2018      Reactions   Hydrocodone-acetaminophen    Nausea vomiting      Medication List    STOP taking these medications   ibuprofen 200 MG tablet Commonly known as:  ADVIL,MOTRIN     TAKE  these medications   b complex vitamins tablet Take 1 tablet by mouth daily.   buPROPion 150 MG 12 hr tablet Commonly known as:  WELLBUTRIN SR Take one tab daily for 3 days, then increase to one tab twice daily   Co Q 10 10 MG Caps Take 1 tablet by mouth daily.   lisinopril-hydrochlorothiazide 20-25 MG tablet Commonly known as:  PRINZIDE,ZESTORETIC Take 1 tablet by mouth daily.   multivitamin with minerals Tabs tablet Take 1 tablet by mouth daily.   Naltrexone-buPROPion HCl ER 8-90 MG Tb12 1 tab daily for week one. 1 tab twice daily for week two. 2 tabs with breakfast 1 tab with dinner week three. 2 tabs twice daily week four- hold at this dose   VITAMIN D PO Take 1 tablet by mouth daily.      Donette Larry, CNM 09/17/2018, 8:36 AM

## 2018-09-18 LAB — URINE CULTURE

## 2018-09-19 LAB — GC/CHLAMYDIA PROBE AMP (~~LOC~~) NOT AT ARMC
Chlamydia: NEGATIVE
NEISSERIA GONORRHEA: NEGATIVE

## 2018-09-20 ENCOUNTER — Emergency Department (HOSPITAL_COMMUNITY)
Admission: EM | Admit: 2018-09-20 | Discharge: 2018-09-20 | Disposition: A | Discharge status: Home / Self Care | Payer: BLUE CROSS/BLUE SHIELD | Attending: Emergency Medicine | Admitting: Emergency Medicine

## 2018-09-20 ENCOUNTER — Other Ambulatory Visit: Payer: Self-pay

## 2018-09-20 ENCOUNTER — Emergency Department (HOSPITAL_COMMUNITY): Payer: BLUE CROSS/BLUE SHIELD

## 2018-09-20 ENCOUNTER — Encounter (HOSPITAL_COMMUNITY): Payer: Self-pay | Admitting: *Deleted

## 2018-09-20 DIAGNOSIS — I10 Essential (primary) hypertension: Secondary | ICD-10-CM | POA: Diagnosis not present

## 2018-09-20 DIAGNOSIS — K5792 Diverticulitis of intestine, part unspecified, without perforation or abscess without bleeding: Secondary | ICD-10-CM

## 2018-09-20 DIAGNOSIS — Z79899 Other long term (current) drug therapy: Secondary | ICD-10-CM | POA: Insufficient documentation

## 2018-09-20 DIAGNOSIS — K5732 Diverticulitis of large intestine without perforation or abscess without bleeding: Secondary | ICD-10-CM | POA: Diagnosis not present

## 2018-09-20 DIAGNOSIS — R1030 Lower abdominal pain, unspecified: Secondary | ICD-10-CM | POA: Diagnosis not present

## 2018-09-20 DIAGNOSIS — R197 Diarrhea, unspecified: Secondary | ICD-10-CM | POA: Diagnosis not present

## 2018-09-20 LAB — COMPREHENSIVE METABOLIC PANEL
ALBUMIN: 3.6 g/dL (ref 3.5–5.0)
ALK PHOS: 97 U/L (ref 38–126)
ALT: 17 U/L (ref 0–44)
ANION GAP: 10 (ref 5–15)
AST: 18 U/L (ref 15–41)
BILIRUBIN TOTAL: 1.1 mg/dL (ref 0.3–1.2)
BUN: 19 mg/dL (ref 6–20)
CALCIUM: 9.7 mg/dL (ref 8.9–10.3)
CO2: 26 mmol/L (ref 22–32)
Chloride: 100 mmol/L (ref 98–111)
Creatinine, Ser: 0.85 mg/dL (ref 0.44–1.00)
Glucose, Bld: 93 mg/dL (ref 70–99)
POTASSIUM: 3.5 mmol/L (ref 3.5–5.1)
Sodium: 136 mmol/L (ref 135–145)
TOTAL PROTEIN: 7.3 g/dL (ref 6.5–8.1)

## 2018-09-20 LAB — CBC
HCT: 44.4 % (ref 36.0–46.0)
HEMOGLOBIN: 14.3 g/dL (ref 12.0–15.0)
MCH: 29.1 pg (ref 26.0–34.0)
MCHC: 32.2 g/dL (ref 30.0–36.0)
MCV: 90.4 fL (ref 80.0–100.0)
NRBC: 0 % (ref 0.0–0.2)
PLATELETS: 376 10*3/uL (ref 150–400)
RBC: 4.91 MIL/uL (ref 3.87–5.11)
RDW: 12.7 % (ref 11.5–15.5)
WBC: 14.1 10*3/uL — AB (ref 4.0–10.5)

## 2018-09-20 LAB — I-STAT BETA HCG BLOOD, ED (MC, WL, AP ONLY)

## 2018-09-20 LAB — LIPASE, BLOOD: Lipase: 24 U/L (ref 11–51)

## 2018-09-20 MED ORDER — KETOROLAC TROMETHAMINE 30 MG/ML IJ SOLN
15.0000 mg | Freq: Once | INTRAMUSCULAR | Status: AC
  Administered 2018-09-20: 15 mg via INTRAVENOUS
  Filled 2018-09-20: qty 1

## 2018-09-20 MED ORDER — CIPROFLOXACIN HCL 500 MG PO TABS
500.0000 mg | ORAL_TABLET | Freq: Once | ORAL | Status: AC
  Administered 2018-09-20: 500 mg via ORAL
  Filled 2018-09-20: qty 1

## 2018-09-20 MED ORDER — IOHEXOL 300 MG/ML  SOLN
100.0000 mL | Freq: Once | INTRAMUSCULAR | Status: AC | PRN
  Administered 2018-09-20: 100 mL via INTRAVENOUS

## 2018-09-20 MED ORDER — METRONIDAZOLE 500 MG PO TABS
500.0000 mg | ORAL_TABLET | Freq: Once | ORAL | Status: AC
  Administered 2018-09-20: 500 mg via ORAL
  Filled 2018-09-20: qty 1

## 2018-09-20 MED ORDER — METRONIDAZOLE 500 MG PO TABS: 500.0000 mg | ORAL_TABLET | Freq: Two times a day (BID) | ORAL | 0 refills | Status: DC

## 2018-09-20 MED ORDER — SODIUM CHLORIDE 0.9 % IV BOLUS
1000.0000 mL | Freq: Once | INTRAVENOUS | Status: AC
  Administered 2018-09-20: 1000 mL via INTRAVENOUS

## 2018-09-20 MED ORDER — TRAMADOL HCL 50 MG PO TABS: 50.0000 mg | ORAL_TABLET | Freq: Four times a day (QID) | ORAL | 0 refills | Status: DC | PRN

## 2018-09-20 MED ORDER — CIPROFLOXACIN HCL 500 MG PO TABS: 500.0000 mg | ORAL_TABLET | Freq: Two times a day (BID) | ORAL | 0 refills | Status: DC

## 2018-09-20 NOTE — ED Triage Notes (Signed)
Pt in c/o abdominal pain that started yesterday, [facility] and was [facility] after exam, history of diverticulitis, today noticed bleeding, she is unsure if it is from her vaginal or rectum, had diarrhea today

## 2018-09-20 NOTE — Discharge Instructions (Signed)
Below is the interpretation of today's CT study:  Study Result   CLINICAL DATA:  Abdominal pain beginning today. History of diverticulitis.   EXAM: CT ABDOMEN AND PELVIS WITH CONTRAST   TECHNIQUE: Multidetector CT imaging of the abdomen and pelvis was performed using the standard protocol following bolus administration of intravenous contrast.   CONTRAST:  OMNIPAQUE IOHEXOL 300 MG/ML  SOLN   COMPARISON:  CT abdomen and pelvis October 19, 2004   FINDINGS: LOWER CHEST: Dependent atelectasis. Included heart size is normal. No pericardial effusion.   HEPATOBILIARY: Liver and gallbladder are normal. Gallbladder Phrygian cap.   PANCREAS: Normal.   SPLEEN: Normal.   ADRENALS/URINARY TRACT: Kidneys are orthotopic, demonstrating symmetric enhancement. No nephrolithiasis, hydronephrosis or solid renal masses. Too small to characterize hypodensity upper pole RIGHT kidney. The unopacified ureters are normal in course and caliber. Delayed imaging through the kidneys demonstrates symmetric prompt contrast excretion within the proximal urinary collecting system. Urinary bladder is partially distended and unremarkable. Normal adrenal glands.   STOMACH/BOWEL: Severe colonic diverticulosis with superimposed short segment of proximal sigmoid inflammatory changes with mild wall thickening. Very small hiatal hernia. The stomach, small bowel are normal in course and caliber without inflammatory changes. Normal appendix.   VASCULAR/LYMPHATIC: Aortoiliac vessels are normal in course and caliber. Mild calcific atherosclerosis. No lymphadenopathy by CT size criteria.   REPRODUCTIVE: 3.6 cm benign-appearing cyst RIGHT ovary, no routine indicated follow-up. This recommendation follows ACR consensus guidelines: White Paper of the ACR Incidental Findings Committee II on Adnexal Findings. J Am Coll Radiol [phone].   OTHER: No intraperitoneal free fluid or free air.     MUSCULOSKELETAL: Nonacute. Severe L3-4 disc height loss with vacuum disc and endplate spurring compatible with degenerative disc.   IMPRESSION: 1. Acute sigmoid diverticulitis without complication.   Aortic Atherosclerosis (ICD10-I70.0).     Electronically Signed   By: Awilda Metro M.D.   On: 09/20/2018 13:57

## 2018-09-20 NOTE — ED Provider Notes (Signed)
[facility] EMERGENCY DEPARTMENT Provider Note   CSN: [id number] Arrival date & time: 09/20/18  1004     History   Chief Complaint Chief Complaint  Patient presents with  . Abdominal Pain    HPI Tamara Frazier is a 47 y.o. female.  HPI  Patient presents with concern of abdominal pain, diarrhea, possible bloody stool. Patient notes a history of diverticulitis in the distant past, and she notes that she is currently.  Menopausal. She has had some vaginal spotting over the past few weeks, as well as inconsistent lower abdominal crampy sensation. She notes that over the past few days she has had new episodes of diarrhea, and had 2 episodes of lightheadedness after severe lower abdominal cramps. 2 days ago after 1 of these episodes she went to our [facility] facility, had evaluation including physical exam, labs, and was discharged with outpatient follow-up. This morning, about 8 hours ago she had another episode of lightheadedness after severe abdominal cramps, noticed which she initially thought was vaginal bleeding, but later thought to be rectal bleeding following an episode of diarrhea. No syncope, no complete fall, no lightheadedness, and currently the pain is moderate, there was severe at its maximal discomfort.   Past Medical History:  Diagnosis Date  . Blood transfusion   . Complication of anesthesia   . Diverticulitis   . 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 Hyperlipemia   . Hypertension   . Migraine   . Pneumococcal infection    pt states she has had it 2 times   . PONV (postoperative nausea and vomiting)     Patient Active Problem List   Diagnosis Date Noted  . Healthcare maintenance 01/19/2018  . Chronic midline low back pain without sciatica 01/19/2018  . BMI 36.0-36.9,adult 01/19/2018  . Chest pain 11/05/2014  . HTN  (hypertension) 11/05/2014  . Hyperlipidemia 11/05/2014    Past Surgical History:  Procedure Laterality Date  . CESAREAN SECTION    . HYSTEROSCOPY N/A 06/18/2016   Procedure: DX HYSTEROSCOPY  with removal of embedded IUD;  Surgeon: Olivia Mackie, MD;  Location: [facility];  Service: Gynecology;  Laterality: N/A;  1hr.  . IUD REMOVAL  2017  . TUBAL LIGATION    . Tubal ligation reversal       OB History    Gravida  6   Para  5   Term  5   Preterm      AB      Living  5     SAB      TAB      Ectopic      Multiple      Live Births  1            Home Medications    Prior to Admission medications   Medication Sig Start Date End Date Taking? Authorizing Provider  b complex vitamins tablet Take 1 tablet by mouth daily.    [provider]  buPROPion (WELLBUTRIN SR) 150 MG 12 hr tablet Take one tab daily for 3 days, then increase to one tab twice daily 09/14/18   Danford, Orpha Bur D, NP  Cholecalciferol (VITAMIN D PO) Take 1 tablet by mouth daily.    [provider]  Coenzyme Q10 (CO Q 10) 10 MG CAPS Take 1 tablet by mouth daily.     [provider]  lisinopril-hydrochlorothiazide (  PRINZIDE,ZESTORETIC) 20-25 MG tablet Take 1 tablet by mouth daily. 01/19/18 01/19/19  William Hamburger D, NP  Multiple Vitamin (MULTIVITAMIN WITH MINERALS) TABS Take 1 tablet by mouth daily.    [provider]  Naltrexone-buPROPion HCl ER 8-90 MG TB12 1 tab daily for week one. 1 tab twice daily for week two. 2 tabs with breakfast 1 tab with dinner week three. 2 tabs twice daily week four- hold at this dose 09/13/18   Danford, Jinny Blossom, NP    Family History Family History  Problem Relation Age of Onset  . CAD Father 24       CABG, died 18  . Heart attack Father   . Hypertension Father   . Hyperlipidemia Father   . Hypertension Mother   . Clotting disorder Mother   . Diabetes Sister   . Hypertension Sister   . Hepatitis C Sister   . COPD Sister     Social  History Social History   Tobacco Use  . Smoking status: Never Smoker  . Smokeless tobacco: Never Used  Substance Use Topics  . Alcohol use: Yes    Comment: occ  . Drug use: No     Allergies   Hydrocodone-acetaminophen   Review of Systems Review of Systems  Constitutional:       Per HPI, otherwise negative  HENT:       Per HPI, otherwise negative  Respiratory:       Per HPI, otherwise negative  Cardiovascular:       Per HPI, otherwise negative  Gastrointestinal: Positive for abdominal pain, blood in stool, diarrhea and nausea. Negative for vomiting.  Endocrine:       Negative aside from HPI  Genitourinary:       Neg aside from HPI   Musculoskeletal:       Per HPI, otherwise negative  Skin: Negative.   Neurological: Positive for light-headedness. Negative for syncope.     Physical Exam Updated Vital Signs BP 135/72 (BP Location: Right Arm)   Pulse 74   Temp 98 F (36.7 C) (Oral)   Resp 16   Ht 5\' 3"  (1.6 m)   Wt 91 kg   LMP 08/29/2018   SpO2 100%   BMI 35.54 kg/m   Physical Exam  Constitutional: She is oriented to person, place, and time. She appears well-developed and well-nourished. No distress.  HENT:  Head: Normocephalic and atraumatic.  Eyes: Conjunctivae and EOM are normal.  Cardiovascular: Regular rhythm.  tachy  Pulmonary/Chest: Effort normal and breath sounds normal. No stridor. No respiratory distress.  Abdominal: She exhibits no distension. There is tenderness in the suprapubic area.  Musculoskeletal: She exhibits no edema.  Neurological: She is alert and oriented to person, place, and time. No cranial nerve deficit.  Skin: Skin is warm and dry.  Psychiatric: She has a normal mood and affect.  Nursing note and vitals reviewed.    ED Treatments / Results  Labs (all labs ordered are listed, but only abnormal results are displayed) Labs Reviewed  CBC - Abnormal; Notable for the following components:      Result Value   WBC 14.1 (*)     All other components within normal limits  LIPASE, BLOOD  COMPREHENSIVE METABOLIC PANEL  I-STAT BETA HCG BLOOD, ED ([facility], [facility], [facility] ONLY)   Procedures Procedures (including critical care time)  Medications Ordered in ED Medications  sodium chloride 0.9 % bolus 1,000 mL (0 mLs Intravenous Stopped 09/20/18 1354)  ketorolac (TORADOL) 30 MG/ML injection  15 mg (15 mg Intravenous Given 09/20/18 1204)  iohexol (OMNIPAQUE) 300 MG/ML solution 100 mL (100 mLs Intravenous Contrast Given 09/20/18 1327)     Initial Impression / Assessment and Plan / ED Course  I have reviewed the triage vital signs and the nursing notes.  Pertinent labs & imaging results that were available during my care of the patient were reviewed by me and considered in my medical decision making (see chart for details).    After the initial evaluation I reviewed the patient's chart including notes from [facility] hospital visit 2 days ago, with pelvic exam, labs, most notable for mild leukocytosis.  2:36 PM Patient awake and alert, speaking clearly. She is now accompanied by her husband. We reviewed all findings including CT consistent with acute diverticulitis, labs with increasing leukocytosis compared to 2 days ago. Patient has a non-peritoneal abdomen, is afebrile, with no evidence for perforation or abscess, is appropriate for initiation of outpatient therapy, after initial antibiotics provided here. Patient has a gastroenterologist with whom she will follow-up.   Final Clinical Impressions(s) / ED Diagnoses   Final diagnoses:  Acute diverticulitis   Ciprofloxacin Flagyl Tramadol.   Gerhard Munch, MD 09/20/18 [phone]

## 2018-09-25 ENCOUNTER — Ambulatory Visit (INDEPENDENT_AMBULATORY_CARE_PROVIDER_SITE_OTHER): Payer: BLUE CROSS/BLUE SHIELD | Admitting: Nurse Practitioner

## 2018-09-25 ENCOUNTER — Encounter: Payer: Self-pay | Admitting: Nurse Practitioner

## 2018-09-25 ENCOUNTER — Encounter: Payer: Self-pay | Admitting: Adult Health

## 2018-09-25 VITALS — BP 136/84 | HR 73 | Resp 16 | Ht 63.0 in | Wt 197.4 lb

## 2018-09-25 DIAGNOSIS — K5732 Diverticulitis of large intestine without perforation or abscess without bleeding: Secondary | ICD-10-CM

## 2018-09-25 MED ORDER — ONDANSETRON HCL 4 MG PO TABS: 4.0000 mg | ORAL_TABLET | Freq: Three times a day (TID) | ORAL | 0 refills | Status: DC | PRN

## 2018-09-25 MED ORDER — AMOXICILLIN-POT CLAVULANATE 875-125 MG PO TABS: 1.0000 | ORAL_TABLET | Freq: Two times a day (BID) | ORAL | 0 refills | Status: DC

## 2018-09-25 NOTE — Progress Notes (Signed)
ASSESSMENT /    101. 47 year old female with recurrent diverticulitis.  Initial episode around 2001.  She describes to" minor episodes" 4-5 years ago.  CT scan in ED 09/20/2018 shows uncomplicated sigmoid diverticulitis  2.  Rectal bleeding.  Interestingly the rectal bleeding occurs only with these episodes of diverticulitis, including the present episode.  Unusual to have bleeding with diverticulitis   PLAN:    -She is not tolerating antibiotics due to nausea. -Discontinue Cipro/Flagyl. 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  She was having constipation leading up to the symptoms.  CT scan compatible with uncomplicated sigmoid diverticulitis.  Patient was given prescriptions for Cipro, Flagyl and tramadol.  She does not feel any better but then only taking 1 or 2 doses of antibiotics a day because of nausea.  No vomiting.  No fevers.  Mitra this is her first episode of diverticulitis was around 2001.  She was having a lot of associated rectal bleeding at the time.  Says she had an emergent colonoscopy in the ED by [facility].  Apparently required blood transfusion.  He did follow up with [facility] a few years later and has not been back in many years.  No other colonoscopies have been done  Patient has history of chronic intermittent constipation but manages it well taking fiber as needed.  She has no rectal bleeding in between episodes of diverticulitis. Patient has been having some vaginal bleeding, says she is going through " the change".  She  has a history of UTIs, no present urinary symptoms.    Chief Complaint:    Follow up ED visit for diverticulitis.    Data Reviewed:  WBC 14 Hemoglobin 14.3 Liver test normal CTAP with contrast -severe colonic diverticulosis with superimposed short segment of proximal sigmoid inflammatory changes and mild wall thickening.  There is small hiatal hernia.   Past Medical History:  Diagnosis Date  . Blood transfusion   . Complication of anesthesia   . Diverticulitis   . 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 Hyperlipemia   . Hypertension   . Migraine   . Pneumococcal infection    pt states she has had it 2 times   . PONV (postoperative nausea and vomiting)      Past Surgical History:  Procedure Laterality Date  . CESAREAN SECTION    . HYSTEROSCOPY N/A 06/18/2016   Procedure: DX HYSTEROSCOPY  with removal of embedded IUD;  Surgeon: Olivia Mackie, MD;  Location: [facility];  Service: Gynecology;  Laterality: N/A;  1hr.  . IUD REMOVAL  2017  . TUBAL LIGATION    . Tubal ligation reversal     Family History  Problem Relation Age of Onset  . CAD Father 73       CABG, died 79  . Heart attack Father   . Hypertension Father   . Hyperlipidemia Father   . Hypertension Mother   . Clotting disorder  Mother   . Diabetes Sister   . Hypertension Sister   . Hepatitis C Sister   . COPD Sister    Social History   Tobacco Use  . Smoking status: Never Smoker  . Smokeless tobacco: Never Used  Substance Use Topics  . Alcohol use: Yes    Comment: occassionally (1x per month)  . Drug use: No   Current Outpatient Medications  Medication Sig Dispense Refill  . b complex vitamins tablet Take 1 tablet by mouth daily.    . ciprofloxacin (CIPRO) 500 MG tablet Take 1 tablet (500 mg total) by mouth 2 (two) times daily. 14 tablet 0  . Coenzyme Q10 (CO Q 10) 10 MG CAPS Take 1 tablet by mouth daily.     Marland Kitchen lisinopril-hydrochlorothiazide (PRINZIDE,ZESTORETIC) 20-25 MG  tablet Take 1 tablet by mouth daily. 90 tablet 2  . metroNIDAZOLE (FLAGYL) 500 MG tablet Take 1 tablet (500 mg total) by mouth 2 (two) times daily. 14 tablet 0  . Multiple Vitamin (MULTIVITAMIN WITH MINERALS) TABS Take 1 tablet by mouth daily.    . Naltrexone-buPROPion HCl ER 8-90 MG TB12 1 tab daily for week one. 1 tab twice daily for week two. 2 tabs with breakfast 1 tab with dinner week three. 2 tabs twice daily week four- hold at this dose 120 tablet 0  . Naltrexone-buPROPion HCl ER 8-90 MG TB12 Take 1 tablet by mouth 2 (two) times daily.    . traMADol (ULTRAM) 50 MG tablet Take 1 tablet (50 mg total) by mouth every 6 (six) hours as needed. 15 tablet 0   No current facility-administered medications for this visit.    Allergies  Allergen Reactions  . Hydrocodone-Acetaminophen     Nausea vomiting     Review of Systems: All systems reviewed and negative except where noted in HPI.   Serum creatinine: 0.85 mg/dL 09/81/19 1478 Estimated creatinine clearance: 87.7 mL/min   Physical Exam:    Wt Readings from Last 3 Encounters:  09/25/18 197 lb 6.4 oz (89.5 kg)  09/20/18 200 lb 9.9 oz (91 kg)  09/17/18 201 lb (91.2 kg)    Ht 5\' 3"  (1.6 m)   Wt 197 lb 6.4 oz (89.5 kg)   LMP 09/25/2018   BMI 34.97 kg/m  Constitutional:  Pleasant female in no acute distress. Psychiatric: Normal mood and affect. Behavior is normal. EENT: Pupils normal.  Conjunctivae are normal. No scleral icterus. Neck supple.  Cardiovascular: Normal rate, regular rhythm. No edema Pulmonary/chest: Effort normal and breath sounds normal. No wheezing, rales or rhonchi. Abdominal: Soft, nondistended, moderate left lower quadrant tenderness . Bowel sounds active throughout. There are no masses palpable. No hepatomegaly. Neurological: Alert and oriented to person place and time. Skin: Skin is warm and dry. No rashes noted.  Tamara Cluster, NP  09/25/2018, 8:37 AM

## 2018-09-25 NOTE — Patient Instructions (Addendum)
If you are age 46 or older, your body mass index should be between 23-30. Your Body mass index is 34.97 kg/m. If this is out of the aforementioned range listed, please consider follow up with your Primary Care Provider.  If you are age 14 or younger, your body mass index should be between 19-25. Your Body mass index is 34.97 kg/m. If this is out of the aformentioned range listed, please consider follow up with your Primary Care Provider.   We have sent the following medications to your pharmacy for you to pick up at your convenience: Zofran Augmentin  STOP Cipro and Flagyl.  Follow up with me on October 02, 2018 at 8:30 am.  Thank you for choosing me and [facility].   Willette Cluster, NP

## 2018-09-26 ENCOUNTER — Other Ambulatory Visit: Payer: Self-pay | Admitting: Adult Health

## 2018-09-26 DIAGNOSIS — I700 Atherosclerosis of aorta: Secondary | ICD-10-CM

## 2018-09-26 DIAGNOSIS — Z8249 Family history of ischemic heart disease and other diseases of the circulatory system: Secondary | ICD-10-CM

## 2018-09-26 DIAGNOSIS — I10 Essential (primary) hypertension: Secondary | ICD-10-CM

## 2018-10-02 ENCOUNTER — Ambulatory Visit: Payer: BLUE CROSS/BLUE SHIELD | Admitting: Nurse Practitioner

## 2018-10-02 NOTE — Progress Notes (Signed)
Reviewed and agree with documentation and assessment and plan. K. Veena Nandigam , MD   

## 2018-10-04 ENCOUNTER — Ambulatory Visit: Payer: BLUE CROSS/BLUE SHIELD | Admitting: Nurse Practitioner

## 2018-10-06 ENCOUNTER — Ambulatory Visit (INDEPENDENT_AMBULATORY_CARE_PROVIDER_SITE_OTHER): Payer: BLUE CROSS/BLUE SHIELD | Admitting: Nurse Practitioner

## 2018-10-06 ENCOUNTER — Telehealth: Payer: Self-pay

## 2018-10-06 ENCOUNTER — Encounter: Payer: Self-pay | Admitting: Nurse Practitioner

## 2018-10-06 VITALS — BP 110/68 | HR 74 | Ht 63.0 in | Wt 196.0 lb

## 2018-10-06 DIAGNOSIS — K5732 Diverticulitis of large intestine without perforation or abscess without bleeding: Secondary | ICD-10-CM | POA: Diagnosis not present

## 2018-10-06 MED ORDER — NA SULFATE-K SULFATE-MG SULF 17.5-3.13-1.6 GM/177ML PO SOLN: ORAL | 0 refills | Status: DC

## 2018-10-06 NOTE — Progress Notes (Signed)
Reviewed and agree with documentation and assessment and plan. K. Veena Nandigam , MD   

## 2018-10-06 NOTE — Patient Instructions (Addendum)
If you are age 46 or older, your body mass index should be between 23-30. Your Body mass index is 34.72 kg/m. If this is out of the aforementioned range listed, please consider follow up with your Primary Care Provider.  If you are age 46 or younger, your body mass index should be between 19-25. Your Body mass index is 34.72 kg/m. If this is out of the aformentioned range listed, please consider follow up with your Primary Care Provider.   You have been scheduled for a colonoscopy. Please follow written instructions given to you at your visit today.  Please pick up your prep supplies at the pharmacy within the next 1-3 days. If you use inhalers (even only as needed), please bring them with you on the day of your procedure. Your physician has requested that you go to www.startemmi.com and enter the access code given to you at your visit today. This web site gives a general overview about your procedure. However, you should still follow specific instructions given to you by our office regarding your preparation for the procedure.  We have sent the following medications to your pharmacy for you to pick up at your convenience: Suprep  You may have a light breakfast the morning of prep day (the day before the procedure).   You may choose from one of the following items: eggs and toast OR chicken noodle soup and crackers.   You should have your breakfast completed between 8:00 and 9:00 am the day before your procedure.    After you have had your light breakfast you should start a clear liquid diet only, NO SOLIDS. No additional solid food is allowed. You may continue to have clear liquid up to 3 hours prior to your procedure.   Thank you for choosing me and [facility].   Willette ClusterPaula Guenther, NP

## 2018-10-06 NOTE — Telephone Encounter (Signed)
Called patient to let her know she doesn't need to stop Contrave for procedure.  Mailbox full .  Unable to leave a message.

## 2018-10-06 NOTE — Telephone Encounter (Signed)
-----   Message from Meredith PelPaula M Guenther, NP sent at 10/06/2018 12:22 PM EST ----- Malachi BondsGloria, no need to hold that diet med per Jonny RuizJohn. Thanks ----- Message ----- From: Cathlyn ParsonsNulty, John, CRNA Sent: 10/06/2018  10:40 AM EST To: Meredith PelPaula M Guenther, NP  Gunnar FusiPaula,  No problems with that agent.  Thanks for checking  John ----- Message ----- From: Meredith PelGuenther, Paula M, NP Sent: 10/06/2018   9:16 AM EST To: Cathlyn ParsonsJohn Nulty, CRNA  Good morning.  Can you tell me if this medication needs to be held prior to procedure?  It is called Contrave and is used for weight loss but it is not one that I am familiar with so I wanted to ask.  Thanks

## 2018-10-06 NOTE — Progress Notes (Signed)
Chief Complaint:   Follow-up diverticulitis  IMPRESSION and PLAN:    26.  47 year old female with resolving uncomplicated sigmoid diverticulitis -Patient needs a colonoscopy to exclude underlying colon neoplasm.  Will schedule this out for approximately 6 weeks. The risks and benefits of colonoscopy with possible polypectomy were discussed and the patient agrees to proceed.  -Patient will call in the interim should she develop any recurrent abdominal pain  2.  Mild constipation-possibly secondary to medication.  Able to manage with hydration and occasional MiraLAX  HPI:     Tamara Frazier is a 47 year old female who I saw approximately 10 days ago for diverticulitis.  CT scan in ED 09/20/2018 showed uncomplicated sigmoid diverticulitis.  This was her second episode, first one back in 2001.  Both times she had associated hematochezia.  ED treated her with Cipro and Flagyl but because of the nausea she was only taking about 1 dose of each drug a day and obviously was not improving.  I changed her to augmentin which she tolerated without any nausea or other side effects.  She almost immediately began feeling better.  She is here for follow-up  Tamara Frazier has almost finished the Augmentin.  She has not had any recent abdominal pain.  No recent rectal bleeding.  She does admit to some mild constipation but attributes it Contrave which she is taking for weight loss.  So far she has been managing the constipation by staying well-hydrated.  Occasionally she takes MiraLAX.  Review of systems:     No chest pain, no SOB, no fevers, no urinary sx   Past Medical History:  Diagnosis Date  . Blood transfusion   . Complication of anesthesia   . Diverticulitis   . 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 Hyperlipemia   . Hypertension   . Migraine   . Pneumococcal infection    pt states she has had  it 2 times   . PONV (postoperative nausea and vomiting)     Patient's surgical history, family medical history, social history, medications and allergies were all reviewed in Epic   Serum creatinine: 0.85 mg/dL 04/54/09 8119 Estimated creatinine clearance: 87.5 mL/min  Current Outpatient Medications  Medication Sig Dispense Refill  . amoxicillin-clavulanate (AUGMENTIN) 875-125 MG tablet Take 1 tablet by mouth 2 (two) times daily. 20 tablet 0  . b complex vitamins tablet Take 1 tablet by mouth daily.    . Coenzyme Q10 (CO Q 10) 10 MG CAPS Take 1 tablet by mouth daily.     Marland Kitchen lisinopril-hydrochlorothiazide (PRINZIDE,ZESTORETIC) 20-25 MG tablet Take 1 tablet by mouth daily. 90 tablet 2  . Multiple Vitamin (MULTIVITAMIN WITH MINERALS) TABS Take 1 tablet by mouth daily.    . Naltrexone-buPROPion HCl ER 8-90 MG TB12 Take 1 tablet by mouth 2 (two) times daily.    . ondansetron (ZOFRAN) 4 MG tablet Take 1 tablet (4 mg total) by mouth every 8 (eight) hours as needed for nausea or vomiting. 30 tablet 0  . traMADol (ULTRAM) 50 MG tablet Take 1 tablet (50 mg total) by mouth every 6 (six) hours as needed. 15 tablet 0   No current facility-administered medications for this visit.     Physical Exam:     BP 110/68   Pulse 74   Ht 5\' 3"  (1.6 m)   Wt 196 lb (88.9 kg)   LMP 09/25/2018  BMI 34.72 kg/m   GENERAL:  Pleasant female in NAD PSYCH: : Cooperative, normal affect PULM: Normal respiratory effort, lungs CTA bilaterally, no wheezing  ABDOMEN:  Nondistended, soft, nontender. No obvious masses, no hepatomegaly,  normal bowel sounds SKIN:  turgor, no lesions seen Musculoskeletal:  Normal muscle tone, normal strength NEURO: Alert and oriented x 3, no focal neurologic deficits   Willette ClusterPaula Orenthal Debski , NP 10/06/2018, 8:43 AM

## 2018-10-09 NOTE — Progress Notes (Deleted)
Subjective:    Patient ID: Tamara Frazier, female    DOB: Feb 13, 1971, 47 y.o.   MRN: [id number]  HPI: 09/13/18 OV:  Tamara Frazier presents with increased anxiety/depression and continued struggle with obesity. Increased irritability and "edginess" and increase in depression. She works a hectic schedule of two jobs and 5 children She intermittently exercises- walking She sporadically follows heart healthy diet, then "will fall off the wagon" She continues to abstain from tobacco/vape use She denies thoughts of harming herself/others She also reports struggling with concentration/focus  Of note- she did not experience any weight loss on Phentermine/Topiramate and stopped medication  10/11/18 OV: Tamara Frazier presents for  Of Note- 09/20/18 ED Encounter for Acute Diverticulitis, CT consistent with acute diverticulitis She followed up with GI- 09/25/18: She is not tolerating antibiotics due to nausea. -Discontinue Cipro/Flagyl. -Augmentin 875 mg twice daily x10 days -She has Ultram at home given to her by the ED -Zofran 4 mg every 8 hours as needed -Return to clinic in 7 to 10 days, sooner if getting worse -After resolution of diverticulitis patient will need colonoscopy Patient Care Team    Relationship Specialty Notifications Start End  Julaine Fusi, NP PCP - General Family Medicine  12/20/17   Gastroenterology, [facility]    01/19/18   Neurology, [facility]  Neurology  01/19/18   Cardiology, [facility]  Cardiology  01/19/18   Ob/Gyn, [facility]  Obstetrics and Gynecology  01/19/18     Patient Active Problem List   Diagnosis Date Noted  . Healthcare maintenance 01/19/2018  . Chronic midline low back pain without sciatica 01/19/2018  . BMI 36.0-36.9,adult 01/19/2018  . Chest pain 11/05/2014  . HTN (hypertension) 11/05/2014  . Hyperlipidemia 11/05/2014     Past Medical History:  Diagnosis Date  . Blood transfusion   . Complication of anesthesia    . Diverticulitis   . 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 Hyperlipemia   . Hypertension   . Migraine   . Pneumococcal infection    pt states she has had it 2 times   . PONV (postoperative nausea and vomiting)      Past Surgical History:  Procedure Laterality Date  . CESAREAN SECTION    . HYSTEROSCOPY N/A 06/18/2016   Procedure: DX HYSTEROSCOPY  with removal of embedded IUD;  Surgeon: Olivia Mackie, MD;  Location: [facility];  Service: Gynecology;  Laterality: N/A;  1hr.  . IUD REMOVAL  2017  . TUBAL LIGATION    . Tubal ligation reversal       Family History  Problem Relation Age of Onset  . CAD Father 29       CABG, died 37  . Heart attack Father   . Hypertension Father   . Hyperlipidemia Father   . Hypertension Mother   . Clotting disorder Mother   . Diabetes Sister   . Hypertension Sister   . Hepatitis C Sister   . COPD Sister      Social History   Substance and Sexual Activity  Drug Use No     Social History   Substance and Sexual Activity  Alcohol Use Yes   Comment: occassionally (1x per month)     Social History   Tobacco Use  Smoking Status Never Smoker  Smokeless Tobacco Never Used     Outpatient Encounter Medications as of 10/11/2018  Medication  Sig  . amoxicillin-clavulanate (AUGMENTIN) 875-125 MG tablet Take 1 tablet by mouth 2 (two) times daily.  Marland Kitchen. b complex vitamins tablet Take 1 tablet by mouth daily.  . Coenzyme Q10 (CO Q 10) 10 MG CAPS Take 1 tablet by mouth daily.   Marland Kitchen. lisinopril-hydrochlorothiazide (PRINZIDE,ZESTORETIC) 20-25 MG tablet Take 1 tablet by mouth daily.  . Multiple Vitamin (MULTIVITAMIN WITH MINERALS) TABS Take 1 tablet by mouth daily.  . Na Sulfate-K Sulfate-Mg Sulf 17.5-3.13-1.6 GM/177ML SOLN Suprep-Use as directed  . Naltrexone-buPROPion HCl ER 8-90 MG TB12 Take 1 tablet by mouth 2 (two) times daily.  .  ondansetron (ZOFRAN) 4 MG tablet Take 1 tablet (4 mg total) by mouth every 8 (eight) hours as needed for nausea or vomiting.  . traMADol (ULTRAM) 50 MG tablet Take 1 tablet (50 mg total) by mouth every 6 (six) hours as needed.   No facility-administered encounter medications on file as of 10/11/2018.     Allergies: Hydrocodone-acetaminophen  There is no height or weight on file to calculate BMI.  Last menstrual period 09/25/2018.  Review of Systems  Constitutional: Positive for fatigue. Negative for activity change, appetite change, chills, diaphoresis, fever and unexpected weight change.  HENT: Negative for congestion.   Eyes: Negative for visual disturbance.  Respiratory: Negative for cough, chest tightness, wheezing and stridor.   Cardiovascular: Negative for chest pain, palpitations and leg swelling.  Skin: Negative for color change, pallor, rash and wound.  Neurological: Negative for dizziness and headaches.  Hematological: Does not bruise/bleed easily.  Psychiatric/Behavioral: Positive for decreased concentration, dysphoric mood and sleep disturbance. Negative for agitation, behavioral problems, confusion, hallucinations, self-injury and suicidal ideas. The patient is nervous/anxious and is hyperactive.        Objective:   Physical Exam  Constitutional: She is oriented to person, place, and time. She appears well-developed and well-nourished. No distress.  HENT:  Head: Normocephalic and atraumatic.  Right Ear: External ear normal.  Left Ear: External ear normal.  Nose: Nose normal.  Mouth/Throat: Oropharynx is clear and moist.  Cardiovascular: Normal rate, regular rhythm, normal heart sounds and intact distal pulses.  No murmur heard. Pulmonary/Chest: Effort normal and breath sounds normal. No stridor. No respiratory distress. She has no wheezes. She has no rales. She exhibits no tenderness.  Neurological: She is alert and oriented to person, place, and time.  Skin: Skin  is warm and dry. Capillary refill takes less than 2 seconds. No rash noted. She is not diaphoretic. No erythema. No pallor.  Psychiatric: She has a normal mood and affect. Her behavior is normal. Judgment and thought content normal.  Nursing note and vitals reviewed.      Assessment & Plan:   No diagnosis found.  No problem-specific Assessment & Plan notes found for this encounter.   Pt was in the office today for 25+ minutes, I spent of 75% of time in face to face counseling of patient's various medical conditions and in coordination of care FOLLOW-UP:  No follow-ups on file.

## 2018-10-11 ENCOUNTER — Ambulatory Visit: Payer: BLUE CROSS/BLUE SHIELD | Admitting: Adult Health

## 2018-10-12 NOTE — Progress Notes (Signed)
Reviewed and agree with documentation and assessment and plan. K. Veena Nandigam , MD   

## 2018-10-29 NOTE — Progress Notes (Signed)
Cardiology Office Note   Date:  11/02/2018   ID:  Tamara Frazier, DOB 1971/08/13, MRN [id number]  PCP:  Tamara Fusi, NP  Cardiologist:   No primary care provider on file. Referring:  Tamara Fusi, NP  Chief Complaint  Patient presents with  . Aortic Atherosclerosis      History of Present Illness: Tamara Frazier is a 47 y.o. female who is referred by Tamara Fusi, NP for evaluation of chest pain.  I saw her in 2015.  She had palpitations and a very strong family history.  She had a zero coronary calcium score.   She does have an abnormal EKG with something that looks like an old anteroseptal infarct but an echocardiogram years ago demonstrated normal wall motion.  She feels well.  She has 5 children.  She works as a Paramedic.  She works a lot of hours and does a lot of walking. The patient denies any new symptoms such as chest discomfort, neck or arm discomfort. There has been no new shortness of breath, PND or orthopnea. There have been no reported palpitations, presyncope or syncope.  She came back because she has had a lot of problems with diverticulitis and abdominal pain and had a CT recently and was noted on that to have some aortic atherosclerosis.  Past Medical History:  Diagnosis Date  . Blood transfusion   . Complication of anesthesia   . Diverticulitis   . 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 Hyperlipemia   . Hypertension   . Migraine   . Pneumococcal infection    pt states she has had it 2 times   . PONV (postoperative nausea and vomiting)     Past Surgical History:  Procedure Laterality Date  . CESAREAN SECTION    . HYSTEROSCOPY N/A 06/18/2016   Procedure: DX HYSTEROSCOPY  with removal of embedded IUD;  Surgeon: Tamara Mackie, MD;  Location: [facility];  Service: Gynecology;  Laterality: N/A;  1hr.  . IUD REMOVAL  2017  . TUBAL LIGATION    .  Tubal ligation reversal       Current Outpatient Medications  Medication Sig Dispense Refill  . b complex vitamins tablet Take 1 tablet by mouth daily.    . Coenzyme Q10 (CO Q 10) 10 MG CAPS Take 1 tablet by mouth daily.     Marland Kitchen lisinopril-hydrochlorothiazide (PRINZIDE,ZESTORETIC) 20-25 MG tablet Take 1 tablet by mouth daily. 90 tablet 2  . Multiple Vitamin (MULTIVITAMIN WITH MINERALS) TABS Take 1 tablet by mouth daily.    . Na Sulfate-K Sulfate-Mg Sulf 17.5-3.13-1.6 GM/177ML SOLN Suprep-Use as directed 354 mL 0  . Naltrexone-buPROPion HCl ER 8-90 MG TB12 Take 1 tablet by mouth 2 (two) times daily.     No current facility-administered medications for this visit.     Allergies:   Hydrocodone-acetaminophen    Social History:  The patient  reports that she is a non-smoker but has been exposed to tobacco smoke. She has never used smokeless tobacco. She reports current alcohol use. She reports that she does not use drugs.   Family History:  The patient's family history includes Alcohol abuse in her maternal grandfather; CAD (age of onset: 74) in her father; COPD in her sister; Clotting disorder in her mother; Diabetes in her sister; Heart attack in her father and paternal grandfather; Hepatitis C in her sister;  Hyperlipidemia in her father; Hypertension in her father, mother, and sister; Stroke in her maternal grandmother, paternal grandfather, and paternal grandmother.    ROS:  Please see the history of present illness.   Otherwise, review of systems are positive for none.   All other systems are reviewed and negative.    PHYSICAL EXAM: VS:  BP (!) 138/98   Pulse 69   Ht 5\' 3"  (1.6 m)   Wt 195 lb 6.4 oz (88.6 kg)   BMI 34.61 kg/m  , BMI Body mass index is 34.61 kg/m. GENERAL:  Well appearing HEENT:  Pupils equal round and reactive, fundi not visualized, oral mucosa unremarkable NECK:  No jugular venous distention, waveform within normal limits, carotid upstroke brisk and symmetric, no  bruits, no thyromegaly LYMPHATICS:  No cervical, inguinal adenopathy LUNGS:  Clear to auscultation bilaterally BACK:  No CVA tenderness CHEST:  Unremarkable HEART:  PMI not displaced or sustained,S1 and S2 within normal limits, no S3, no S4, no clicks, no rubs, no murmurs ABD:  Flat, positive bowel sounds normal in frequency in pitch, no bruits, no rebound, no guarding, no midline pulsatile mass, no hepatomegaly, no splenomegaly EXT:  2 plus pulses throughout, no edema, no cyanosis no clubbing SKIN:  No rashes no nodules NEURO:  Cranial nerves II through XII grossly intact, motor grossly intact throughout PSYCH:  Cognitively intact, oriented to person place and time    EKG:  EKG is ordered today. The ekg ordered today demonstrates sinus rhythm, rate 69, poor anterior R wave progression, no acute ST-T wave changes.   Recent Labs: 01/30/2018: TSH 3.330 09/20/2018: ALT 17; BUN 19; Creatinine, Ser 0.85; Hemoglobin 14.3; Platelets 376; Potassium 3.5; Sodium 136    Lipid Panel    Component Value Date/Time   CHOL 237 (H) 01/30/2018 0931   TRIG 97 01/30/2018 0931   HDL 46 01/30/2018 0931   CHOLHDL 5.2 (H) 01/30/2018 0931   CHOLHDL 4.6 07/13/2007 0500   VLDL 21 07/13/2007 0500   LDLCALC 172 (H) 01/30/2018 0931      Wt Readings from Last 3 Encounters:  11/02/18 195 lb 6.4 oz (88.6 kg)  10/06/18 196 lb (88.9 kg)  09/25/18 197 lb 6.4 oz (89.5 kg)      Other studies Reviewed: Additional studies/ records that were reviewed today include: echo 2008, coronary calcium 2015. Review of the above records demonstrates:  Please see elsewhere in the note.     ASSESSMENT AND PLAN:    Aortic atherosclerosis: We discussed this and the implications that would necessitate continued aggressive primary risk reduction.  Toward that end I think is reasonable to repeat a coronary calcium score.  This will also determine how better to manage her dyslipidemia.  Dyslipidemia: The patient had an LDL  of 172 and HDL of 46.  She has changed her diet and exercise.  I told her this would be reasonable but depending on her calcium score to have a low threshold for treating her with a statin and she might agree to this.  With her significant family history she probably has some element of familial dyslipidemia.  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  Her echo predated this abnormal EKG so I will follow-up with a more up-to-date echocardiogram to exclude wall motion abnormality.    Current medicines are reviewed at length with the patient today.  The patient does not have concerns regarding medicines.  The following changes have been  made:  no change  Labs/ tests ordered today include:   Orders Placed This Encounter  Procedures  . CT CARDIAC SCORING  . ECHOCARDIOGRAM COMPLETE     Disposition:   FU with me as needed    Signed, Rollene Rotunda, MD  11/02/2018 11:11 AM    [facility]

## 2018-11-02 ENCOUNTER — Encounter

## 2018-11-02 ENCOUNTER — Ambulatory Visit (INDEPENDENT_AMBULATORY_CARE_PROVIDER_SITE_OTHER): Payer: BLUE CROSS/BLUE SHIELD | Admitting: Cardiology

## 2018-11-02 ENCOUNTER — Encounter: Payer: Self-pay | Admitting: Cardiology

## 2018-11-02 VITALS — BP 138/98 | HR 69 | Ht 63.0 in | Wt 195.4 lb

## 2018-11-02 DIAGNOSIS — E785 Hyperlipidemia, unspecified: Secondary | ICD-10-CM | POA: Diagnosis not present

## 2018-11-02 DIAGNOSIS — R079 Chest pain, unspecified: Secondary | ICD-10-CM | POA: Diagnosis not present

## 2018-11-02 DIAGNOSIS — I700 Atherosclerosis of aorta: Secondary | ICD-10-CM | POA: Diagnosis not present

## 2018-11-02 DIAGNOSIS — R9431 Abnormal electrocardiogram [ECG] [EKG]: Secondary | ICD-10-CM | POA: Diagnosis not present

## 2018-11-02 NOTE — Patient Instructions (Addendum)
Medication Instructions:  Continue current medications  If you need a refill on your cardiac medications before your next appointment, please call your pharmacy.  Labwork: None Ordered    If you have labs (blood work) drawn today and your tests are completely normal, you will receive your results only by [employer]MyChart Message (if you have MyChart) -OR- A paper copy in the mail.  If you have any lab test that is abnormal or we need to change your treatment, we will call you to review these results.  Testing/Procedures: Your physician has requested that you have an echocardiogram. Echocardiography is a painless test that uses sound waves to create images of your heart. It provides your doctor with information about the size and shape of your heart and how well your heart's chambers and valves are working. This procedure takes approximately one hour. There are no restrictions for this procedure.  Your physician has requested that you have a Coronary Calcium Score. This test is done at our [address] [facility].   Follow-Up: . Your physician recommends that you schedule a follow-up appointment in: As Needed  At [facility], you and your health needs are our priority.  As part of our continuing mission to provide you with exceptional heart care, we have created designated Provider Care Teams.  These Care Teams include your primary Cardiologist (physician) and Advanced Practice Providers (APPs -  Physician Assistants and Nurse Practitioners) who all work together to provide you with the care you need, when you need it.

## 2018-11-08 NOTE — Addendum Note (Signed)
Addended by: Evans LanceSTOVER, Samer Dutton W on: 11/08/2018 04:57 PM   Modules accepted: Orders

## 2018-11-09 ENCOUNTER — Ambulatory Visit (INDEPENDENT_AMBULATORY_CARE_PROVIDER_SITE_OTHER)
Admission: RE | Admit: 2018-11-09 | Discharge: 2018-11-09 | Disposition: A | Discharge status: Home / Self Care | Payer: Self-pay | Source: Ambulatory Visit | Attending: Cardiology | Admitting: Cardiology

## 2018-11-09 ENCOUNTER — Ambulatory Visit (HOSPITAL_COMMUNITY): Payer: BLUE CROSS/BLUE SHIELD | Attending: Cardiology

## 2018-11-09 DIAGNOSIS — R079 Chest pain, unspecified: Secondary | ICD-10-CM

## 2018-11-09 DIAGNOSIS — R9431 Abnormal electrocardiogram [ECG] [EKG]: Secondary | ICD-10-CM | POA: Insufficient documentation

## 2018-11-10 ENCOUNTER — Encounter: Payer: Self-pay | Admitting: Gastroenterology

## 2018-11-13 ENCOUNTER — Telehealth: Payer: Self-pay | Admitting: *Deleted

## 2018-11-13 NOTE — Telephone Encounter (Signed)
LEFT MESSAGE - RESULT HAVE BEEN RELEASED TO MYCHART - ANY QUESTION MAY CALL BACK ROUTED TO PRIMARY RESULTS

## 2018-11-13 NOTE — Telephone Encounter (Signed)
-----   Message from Rollene RotundaJames Hochrein, MD sent at 11/09/2018  9:42 PM EST ----- Calcium score was zero.  No further work up.  No indication for statin.  Call Ms. Doell with the results and send results to Julaine Fusianford, Katy D, NP

## 2018-11-17 ENCOUNTER — Encounter: Payer: BLUE CROSS/BLUE SHIELD | Admitting: Gastroenterology

## 2018-12-13 ENCOUNTER — Encounter: Payer: Self-pay | Admitting: Adult Health

## 2018-12-13 ENCOUNTER — Telehealth: Payer: Self-pay | Admitting: Adult Health

## 2018-12-13 MED ORDER — LISINOPRIL-HYDROCHLOROTHIAZIDE 20-25 MG PO TABS: 1.0000 | ORAL_TABLET | Freq: Every day | ORAL | 0 refills | Status: DC

## 2018-12-13 MED FILL — LISINOPRIL-HCTZ 20-25 MG TA: 20-25 | 15 days supply | Qty: 15 | Fill #0

## 2018-12-13 NOTE — Telephone Encounter (Signed)
Patient called says she sent Orpha Bur a Mychart message apologizing for missed   Nov 2019 appt and need for Rx refill on :   lisinopril-hydrochlorothiazide (PRINZIDE,ZESTORETIC) 20-25 MG tablet [[id number]]   Order Details  Dose: 1 tablet Route: Oral Frequency: Daily  Dispense Quantity: 90 tablet Refills: 2 Fills remaining: --        Sig: Take 1 tablet by mouth daily.     -------Pt established appt for 12/21/18 @ 3:00 and states is completely out of Rx & just needs enough to get her to Jan 30th appointment.  --Forwarding message to medical assistant for review w/ provider & if approved to send  Rx refill order to :   [facility] - [place], [place] - [address] [place] [phone] (Phone) [phone] (Fax)   --Fausto Skillern

## 2018-12-20 NOTE — Progress Notes (Signed)
Subjective:    Patient ID: Tamara Frazier, female    DOB: 1971/08/15, 48 y.o.   MRN: [id number]  HPI: 09/13/18 OV:  Tamara Frazier presents with increased anxiety/depression and continued struggle with obesity. Increased irritability and "edginess" and increase in depression. She works a hectic schedule of two jobs and 5 children She intermittently exercises- walking She sporadically follows heart healthy diet, then "will fall off the wagon" She continues to abstain from tobacco/vape use She denies thoughts of harming herself/others She also reports struggling with concentration/focus  Of note- she did not experience any weight loss on Phentermine/Topiramate and stopped medication  12/21/2018 OV: Tamara Frazier is here for f/u: anxiety/depression, HTN, Medical Wt Loss She was started on Naltrexone-Bupropion Oct 2019 She developed abdominal pain, dx'd with acute diverticulitis 09/20/2018- treated with ABX and referral to GI- denies acute GI sx's currently She has lost 12 lbs since last OV- GREAT! She reports stable mood and sig decrease in HAs She walks 7,500- 12,000  Steps/day She has been following Mediterranean diet She has been drinking >90 oz water/day She denies tobacco/vape/excessive ETOH use Current wt 183, goal <160 She also reports intermittent non-productive cough the last 3 weeks She denies fever/chills, known, exposure to influenza  Patient Care Team    Relationship Specialty Notifications Start End  Julaine Fusi, NP PCP - General Family Medicine  12/20/17   Gastroenterology, [facility]    01/19/18   Neurology, [facility]  Neurology  01/19/18   Cardiology, [facility]  Cardiology  01/19/18   Ob/Gyn, [facility]  Obstetrics and Gynecology  01/19/18     Patient Active Problem List   Diagnosis Date Noted  . Cough in adult 12/21/2018  . Dyslipidemia 11/02/2018  . Aortic atherosclerosis (HCC) 11/02/2018  . Abnormal ECG 11/02/2018  . Healthcare  maintenance 01/19/2018  . Chronic midline low back pain without sciatica 01/19/2018  . BMI 32.0-32.9,adult 01/19/2018  . Chest pain 11/05/2014  . HTN (hypertension) 11/05/2014  . Hyperlipidemia 11/05/2014     Past Medical History:  Diagnosis Date  . Blood transfusion   . Complication of anesthesia   . Diverticulitis   . 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 Hyperlipemia   . Hypertension   . Migraine   . Pneumococcal infection    pt states she has had it 2 times   . PONV (postoperative nausea and vomiting)      Past Surgical History:  Procedure Laterality Date  . CESAREAN SECTION    . HYSTEROSCOPY N/A 06/18/2016   Procedure: DX HYSTEROSCOPY  with removal of embedded IUD;  Surgeon: Olivia Mackie, MD;  Location: [facility];  Service: Gynecology;  Laterality: N/A;  1hr.  . IUD REMOVAL  2017  . TUBAL LIGATION    . Tubal ligation reversal       Family History  Problem Relation Age of Onset  . CAD Father 78       CABG, died 34  . Heart attack Father   . Hypertension Father   . Hyperlipidemia Father   . Hypertension Mother   . Clotting disorder Mother   . Diabetes Sister   . Hypertension Sister   . Hepatitis C Sister   . COPD Sister   . Stroke Maternal Grandmother   . Alcohol abuse Maternal Grandfather   . Stroke Paternal Grandmother   . Stroke Paternal Grandfather   .  Heart attack Paternal Grandfather      Social History   Substance and Sexual Activity  Drug Use No     Social History   Substance and Sexual Activity  Alcohol Use Yes   Comment: occassionally (1x per month)     Social History   Tobacco Use  Smoking Status Passive Smoke Exposure - Never Smoker  Smokeless Tobacco Never Used     Outpatient Encounter Medications as of 12/21/2018  Medication Sig  . b complex vitamins tablet Take 1 tablet by mouth daily.  . Coenzyme Q10 (CO Q 10)  10 MG CAPS Take 1 tablet by mouth daily.   Marland Kitchen lisinopril-hydrochlorothiazide (PRINZIDE,ZESTORETIC) 20-25 MG tablet Take 1 tablet by mouth daily.  . Multiple Vitamin (MULTIVITAMIN WITH MINERALS) TABS Take 1 tablet by mouth daily.  . Naltrexone-buPROPion HCl ER 8-90 MG TB12 Take 1 tablet by mouth 2 (two) times daily.  . [DISCONTINUED] lisinopril-hydrochlorothiazide (PRINZIDE,ZESTORETIC) 20-25 MG tablet Take 1 tablet by mouth daily.  . [DISCONTINUED] Naltrexone-buPROPion HCl ER 8-90 MG TB12 Take 1 tablet by mouth 2 (two) times daily.  . [DISCONTINUED] Na Sulfate-K Sulfate-Mg Sulf 17.5-3.13-1.6 GM/177ML SOLN Suprep-Use as directed   No facility-administered encounter medications on file as of 12/21/2018.     Allergies: Hydrocodone-acetaminophen  Body mass index is 32.42 kg/m.  Blood pressure 111/75, pulse 75, temperature 97.7 F (36.5 C), temperature source Oral, height 5\' 3"  (1.6 m), weight 183 lb (83 kg), SpO2 97 %.  Review of Systems  Constitutional: Positive for fatigue. Negative for activity change, appetite change, chills, diaphoresis, fever and unexpected weight change.  HENT: Negative for congestion.   Eyes: Negative for visual disturbance.  Respiratory: Negative for cough, chest tightness, wheezing and stridor.   Cardiovascular: Negative for chest pain, palpitations and leg swelling.  Skin: Negative for color change, pallor, rash and wound.  Neurological: Negative for dizziness and headaches.  Hematological: Does not bruise/bleed easily.  Psychiatric/Behavioral: Positive for decreased concentration, dysphoric mood and sleep disturbance. Negative for agitation, behavioral problems, confusion, hallucinations, self-injury and suicidal ideas. The patient is nervous/anxious and is hyperactive.        Objective:   Physical Exam Vitals signs and nursing note reviewed.  Constitutional:      General: She is not in acute distress.    Appearance: She is well-developed. She is not  diaphoretic.  HENT:     Head: Normocephalic and atraumatic.     Right Ear: External ear normal.     Left Ear: External ear normal.     Nose: Nose normal.  Cardiovascular:     Rate and Rhythm: Normal rate and regular rhythm.     Heart sounds: Normal heart sounds. No murmur.  Pulmonary:     Effort: Pulmonary effort is normal. No respiratory distress.     Breath sounds: Normal breath sounds. No stridor. No wheezing or rales.  Chest:     Chest wall: No tenderness.  Skin:    General: Skin is warm and dry.     Capillary Refill: Capillary refill takes less than 2 seconds.     Coloration: Skin is not pale.     Findings: No erythema or rash.  Neurological:     Mental Status: She is alert and oriented to person, place, and time.  Psychiatric:        Behavior: Behavior normal.        Thought Content: Thought content normal.        Judgment: Judgment normal.  Assessment & Plan:   1. Hypertension, unspecified type   2. BMI 32.0-32.9,adult   3. Cough in adult     BMI 32.0-32.9,adult Continue to drink plenty of water and following Mediterranean Diet. Continue daily walking. Follow-up in 3 months.  Cough in adult If you are still coughing in 2 weeks, send MyChart message and I will send in course of Azithromycin  HTN (hypertension) BP at goal 111/75, HR 75 Continue Prinzide 20/25mg  QD    FOLLOW-UP:  Return in about 3 months (around 03/22/2019) for Regular Follow Up, Medical Weight Loss.

## 2018-12-21 ENCOUNTER — Encounter: Payer: Self-pay | Admitting: Adult Health

## 2018-12-21 ENCOUNTER — Ambulatory Visit (INDEPENDENT_AMBULATORY_CARE_PROVIDER_SITE_OTHER): Payer: BLUE CROSS/BLUE SHIELD | Admitting: Adult Health

## 2018-12-21 VITALS — BP 111/75 | HR 75 | Temp 97.7°F | Ht 63.0 in | Wt 183.0 lb

## 2018-12-21 DIAGNOSIS — Z6832 Body mass index (BMI) 32.0-32.9, adult: Secondary | ICD-10-CM | POA: Diagnosis not present

## 2018-12-21 DIAGNOSIS — R05 Cough: Secondary | ICD-10-CM | POA: Diagnosis not present

## 2018-12-21 DIAGNOSIS — R059 Cough, unspecified: Secondary | ICD-10-CM | POA: Insufficient documentation

## 2018-12-21 DIAGNOSIS — I10 Essential (primary) hypertension: Secondary | ICD-10-CM

## 2018-12-21 MED ORDER — NALTREXONE-BUPROPION HCL ER 8-90 MG PO TB12: 1.0000 | ORAL_TABLET | Freq: Two times a day (BID) | ORAL | 0 refills | Status: DC

## 2018-12-21 MED ORDER — LISINOPRIL-HYDROCHLOROTHIAZIDE 20-25 MG PO TABS: 1.0000 | ORAL_TABLET | Freq: Every day | ORAL | 0 refills | Status: DC

## 2018-12-21 NOTE — Patient Instructions (Signed)
Mediterranean Diet A Mediterranean diet refers to food and lifestyle choices that are based on the traditions of countries located on the [employer]. This way of eating has been shown to help prevent certain conditions and improve outcomes for people who have chronic diseases, like kidney disease and heart disease. What are tips for following this plan? Lifestyle  Cook and eat meals together with your family, when possible.  Drink enough fluid to keep your urine clear or pale yellow.  Be physically active every day. This includes: ? Aerobic exercise like running or swimming. ? Leisure activities like gardening, walking, or housework.  Get 7-8 hours of sleep each night.  If recommended by your health care provider, drink red wine in moderation. This means 1 glass a day for nonpregnant women and 2 glasses a day for men. A glass of wine equals 5 oz (150 mL). Reading food labels   Check the serving size of packaged foods. For foods such as rice and pasta, the serving size refers to the amount of cooked product, not dry.  Check the total fat in packaged foods. Avoid foods that have saturated fat or trans fats.  Check the ingredients list for added sugars, such as corn syrup. Shopping  At the grocery store, buy most of your food from the areas near the walls of the store. This includes: ? Fresh fruits and vegetables (produce). ? Grains, beans, nuts, and seeds. Some of these may be available in unpackaged forms or large amounts (in bulk). ? Fresh seafood. ? Poultry and eggs. ? Low-fat dairy products.  Buy whole ingredients instead of prepackaged foods.  Buy fresh fruits and vegetables in-season from local farmers markets.  Buy frozen fruits and vegetables in resealable bags.  If you do not have access to quality fresh seafood, buy precooked frozen shrimp or canned fish, such as tuna, salmon, or sardines.  Buy small amounts of raw or cooked vegetables, salads, or olives from  the deli or salad bar at your store.  Stock your pantry so you always have certain foods on hand, such as olive oil, canned tuna, canned tomatoes, rice, pasta, and beans. Cooking  Cook foods with extra-virgin olive oil instead of using butter or other vegetable oils.  Have meat as a side dish, and have vegetables or grains as your main dish. This means having meat in small portions or adding small amounts of meat to foods like pasta or stew.  Use beans or vegetables instead of meat in common dishes like chili or lasagna.  Experiment with different cooking methods. Try roasting or broiling vegetables instead of steaming or sauteing them.  Add frozen vegetables to soups, stews, pasta, or rice.  Add nuts or seeds for added healthy fat at each meal. You can add these to yogurt, salads, or vegetable dishes.  Marinate fish or vegetables using olive oil, lemon juice, garlic, and fresh herbs. Meal planning   Plan to eat 1 vegetarian meal one day each week. Try to work up to 2 vegetarian meals, if possible.  Eat seafood 2 or more times a week.  Have healthy snacks readily available, such as: ? Vegetable sticks with hummus. ? [place] yogurt. ? Fruit and nut trail mix.  Eat balanced meals throughout the week. This includes: ? Fruit: 2-3 servings a day ? Vegetables: 4-5 servings a day ? Low-fat dairy: 2 servings a day ? Fish, poultry, or lean meat: 1 serving a day ? Beans and legumes: 2 or more servings a week ?  Nuts and seeds: 1-2 servings a day ? Whole grains: 6-8 servings a day ? Extra-virgin olive oil: 3-4 servings a day  Limit red meat and sweets to only a few servings a month What are my food choices?  Mediterranean diet ? Recommended ? Grains: Whole-grain pasta. Brown rice. Bulgar wheat. Polenta. Couscous. Whole-wheat bread. [place] Cobbatmeal. Quinoa. ? Vegetables: Artichokes. Beets. Broccoli. Cabbage. Carrots. Eggplant. Green beans. Chard. Kale. Spinach. Onions. Leeks. Peas. Squash.  Tomatoes. Peppers. Radishes. ? Fruits: Apples. Apricots. Avocado. Berries. Bananas. Cherries. Dates. Figs. Grapes. Lemons. Melon. Oranges. Peaches. Plums. Pomegranate. ? Meats and other protein foods: Beans. Almonds. Sunflower seeds. Pine nuts. Peanuts. Cod. Salmon. Scallops. Shrimp. Tuna. Tilapia. Clams. Oysters. Eggs. ? Dairy: Low-fat milk. Cheese. Greek yogurt. ? Beverages: Water. Red wine. Herbal tea. ? Fats and oils: Extra virgin olive oil. Avocado oil. Grape seed oil. ? Sweets and desserts: [place] yogurt with honey. Baked apples. Poached pears. Trail mix. ? Seasoning and other foods: Basil. Cilantro. Coriander. Cumin. Mint. Parsley. Sage. Rosemary. Tarragon. Garlic. Oregano. Thyme. Pepper. Balsalmic vinegar. Tahini. Hummus. Tomato sauce. Olives. Mushrooms. ? Limit these ? Grains: Prepackaged pasta or rice dishes. Prepackaged cereal with added sugar. ? Vegetables: Deep fried potatoes (french fries). ? Fruits: Fruit canned in syrup. ? Meats and other protein foods: Beef. Pork. Lamb. Poultry with skin. Hot dogs. Tomasa BlaseBacon. ? Dairy: Ice cream. Sour cream. Whole milk. ? Beverages: Juice. Sugar-sweetened soft drinks. Beer. Liquor and spirits. ? Fats and oils: Butter. Canola oil. Vegetable oil. Beef fat (tallow). Lard. ? Sweets and desserts: Cookies. Cakes. Pies. Candy. ? Seasoning and other foods: Mayonnaise. Premade sauces and marinades. ? The items listed may not be a complete list. Talk with your dietitian about what dietary choices are right for you. Summary  The Mediterranean diet includes both food and lifestyle choices.  Eat a variety of fresh fruits and vegetables, beans, nuts, seeds, and whole grains.  Limit the amount of red meat and sweets that you eat.  Talk with your health care provider about whether it is safe for you to drink red wine in moderation. This means 1 glass a day for nonpregnant women and 2 glasses a day for men. A glass of wine equals 5 oz (150 mL). This information  is not intended to replace advice given to you by your health care provider. Make sure you discuss any questions you have with your health care provider. Document Released: 07/01/2016 Document Revised: 08/03/2016 Document Reviewed: 07/01/2016 Elsevier Interactive Patient Education  2019 [employer]   Exercising to [employer] Minor[employer]ose Weight Exercise is structured, repetitive physical activity to improve fitness and health. Getting regular exercise is important for everyone. It is especially important if you are overweight. Being overweight increases your risk of heart disease, stroke, diabetes, high blood pressure, and several types of cancer. Reducing your calorie intake and exercising can help you lose weight. Exercise is usually categorized as moderate or vigorous intensity. To lose weight, most people need to do a certain amount of moderate-intensity or vigorous-intensity exercise each week. Moderate-intensity exercise  Moderate-intensity exercise is any activity that gets you moving enough to burn at least three times more energy (calories) than if you were sitting. Examples of moderate exercise include:  Walking a mile in 15 minutes.  Doing light yard work.  Biking at an easy pace. Most people should get at least 150 minutes (2 hours and 30 minutes) a week of moderate-intensity exercise to maintain their body weight. Vigorous-intensity exercise Vigorous-intensity exercise is any activity that gets  you moving enough to burn at least six times more calories than if you were sitting. When you exercise at this intensity, you should be working hard enough that you are not able to carry on a conversation. Examples of vigorous exercise include:  Running.  Playing a team sport, such as football, basketball, and soccer.  Jumping rope. Most people should get at least 75 minutes (1 hour and 15 minutes) a week of vigorous-intensity exercise to maintain their body weight. How can exercise affect me? When  you exercise enough to burn more calories than you eat, you lose weight. Exercise also reduces body fat and builds muscle. The more muscle you have, the more calories you burn. Exercise also:  Improves mood.  Reduces stress and tension.  Improves your overall fitness, flexibility, and endurance.  Increases bone strength. The amount of exercise you need to lose weight depends on:  Your age.  The type of exercise.  Any health conditions you have.  Your overall physical ability. Talk to your health care provider about how much exercise you need and what types of activities are safe for you. What actions can I take to lose weight? Nutrition   Make changes to your diet as told by your health care provider or diet and nutrition specialist (dietitian). This may include: ? Eating fewer calories. ? Eating more protein. ? Eating less unhealthy fats. ? Eating a diet that includes fresh fruits and vegetables, whole grains, low-fat dairy products, and lean protein. ? Avoiding foods with added fat, salt, and sugar.  Drink plenty of water while you exercise to prevent dehydration or heat stroke. Activity  Choose an activity that you enjoy and set realistic goals. Your health care provider can help you make an exercise plan that works for you.  Exercise at a moderate or vigorous intensity most days of the week. ? The intensity of exercise may vary from person to person. You can tell how intense a workout is for you by paying attention to your breathing and heartbeat. Most people will notice their breathing and heartbeat get faster with more intense exercise.  Do resistance training twice each week, such as: ? Push-ups. ? Sit-ups. ? Lifting weights. ? Using resistance bands.  Getting short amounts of exercise can be just as helpful as long structured periods of exercise. If you have trouble finding time to exercise, try to include exercise in your daily routine. ? Get up, stretch, and  walk around every 30 minutes throughout the day. ? Go for a walk during your lunch break. ? Park your car farther away from your destination. ? If you take public transportation, get off one stop early and walk the rest of the way. ? Make phone calls while standing up and walking around. ? Take the stairs instead of elevators or escalators.  Wear comfortable clothes and shoes with good support.  Do not exercise so much that you hurt yourself, feel dizzy, or get very short of breath. Where to find more information  [place] Department of Health and Human Services: ThisPath.fi  Centers for Disease Control and Prevention (CDC): FootballExhibition.com.br Contact a health care provider:  Before starting a new exercise program.  If you have questions or concerns about your weight.  If you have a medical problem that keeps you from exercising. Get help right away if you have any of the following while exercising:  Injury.  Dizziness.  Difficulty breathing or shortness of breath that does not go away when you stop  exercising.  Chest pain.  Rapid heartbeat. Summary  Being overweight increases your risk of heart disease, stroke, diabetes, high blood pressure, and several types of cancer.  Losing weight happens when you burn more calories than you eat.  Reducing the amount of calories you eat in addition to getting regular moderate or vigorous exercise each week helps you lose weight. This information is not intended to replace advice given to you by your health care provider. Make sure you discuss any questions you have with your health care provider. Document Released: 12/11/2010 Document Revised: 11/21/2017 Document Reviewed: 11/21/2017 Elsevier Interactive Patient Education  2019 Elsevier Inc.  GREAT JOB ON YOUR BLOOD PRESSURE AND WEIGHT LOSS! Continue to drink plenty of water and following Mediterranean Diet. Continue daily walking. If you are still coughing in 2 weeks, send MyChart message  and I will send in course of Azithromycin. Follow-up in 3 months. GREAT TO SEE YOU!

## 2018-12-21 NOTE — Assessment & Plan Note (Signed)
Continue to drink plenty of water and following Mediterranean Diet. Continue daily walking. Follow-up in 3 months.

## 2018-12-21 NOTE — Assessment & Plan Note (Signed)
BP at goal 111/75, HR 75 Continue Prinzide 20/25mg  QD

## 2018-12-21 NOTE — Assessment & Plan Note (Signed)
If you are still coughing in 2 weeks, send MyChart message and I will send in course of Azithromycin

## 2018-12-22 MED FILL — BUPROPION HCL SR 150 MG TAB: 150 | 16 days supply | Qty: 30 | Fill #0

## 2018-12-22 MED FILL — CONTRAVE ER 8-90 MG TABLET: 8-90 | 35 days supply | Qty: 70 | Fill #0

## 2018-12-25 MED FILL — LISINOPRIL-HCTZ 20-25 MG TA: 20-25 | 90 days supply | Qty: 90 | Fill #0

## 2019-01-10 MED FILL — LISINOPRIL-HCTZ 20-25 MG TA: 20-25 | 90 days supply | Qty: 90 | Fill #0

## 2019-01-10 MED FILL — CONTRAVE ER 8-90 MG TABLET: 8-90 | 35 days supply | Qty: 70 | Fill #0

## 2019-02-06 ENCOUNTER — Other Ambulatory Visit: Payer: Self-pay

## 2019-02-06 ENCOUNTER — Ambulatory Visit (INDEPENDENT_AMBULATORY_CARE_PROVIDER_SITE_OTHER): Payer: BLUE CROSS/BLUE SHIELD | Admitting: Adult Health

## 2019-02-06 ENCOUNTER — Encounter: Payer: Self-pay | Admitting: Adult Health

## 2019-02-06 VITALS — BP 142/90 | HR 71 | Temp 97.5°F | Ht 63.0 in | Wt 194.5 lb

## 2019-02-06 DIAGNOSIS — R05 Cough: Secondary | ICD-10-CM | POA: Diagnosis not present

## 2019-02-06 DIAGNOSIS — R0602 Shortness of breath: Secondary | ICD-10-CM

## 2019-02-06 DIAGNOSIS — R059 Cough, unspecified: Secondary | ICD-10-CM

## 2019-02-06 MED ORDER — PREDNISONE 20 MG PO TABS: ORAL_TABLET | ORAL | 0 refills | Status: DC

## 2019-02-06 MED ORDER — AZITHROMYCIN 250 MG PO TABS: ORAL_TABLET | ORAL | 0 refills | Status: DC

## 2019-02-06 MED ORDER — BENZONATATE 200 MG PO CAPS: 200.0000 mg | ORAL_CAPSULE | Freq: Two times a day (BID) | ORAL | 0 refills | Status: DC | PRN

## 2019-02-06 NOTE — Progress Notes (Signed)
Subjective:    Patient ID: Tamara Frazier, female    DOB: 04-08-1971, 48 y.o.   MRN: [id number]  HPI:  Ms. Garza presents with severe shortness of breath, wheezing, and productive cough (thick/white mucus), sx's present for 3 days. 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- he denies any URI sx's or fever  Patient Care Team    Relationship Specialty Notifications Start End  Sherma Vanmetre, Orpha Bur D, NP PCP - General Family Medicine  12/20/17   Gastroenterology, [facility]    01/19/18   Neurology, [facility]  Neurology  01/19/18   Cardiology, [facility]  Cardiology  01/19/18   Ob/Gyn, [facility]  Obstetrics and Gynecology  01/19/18     Patient Active Problem List   Diagnosis Date Noted  . Shortness of breath 02/06/2019  . Cough in adult 12/21/2018  . Dyslipidemia 11/02/2018  . Aortic atherosclerosis (HCC) 11/02/2018  . Abnormal ECG 11/02/2018  . Healthcare maintenance 01/19/2018  . Chronic midline low back pain without sciatica 01/19/2018  . BMI 32.0-32.9,adult 01/19/2018  . Chest pain 11/05/2014  . HTN (hypertension) 11/05/2014  . Hyperlipidemia 11/05/2014     Past Medical History:  Diagnosis Date  . Blood transfusion   . Complication of anesthesia   . Diverticulitis   . Family history of adverse reaction to anesthesia 1990   pt's father pancreatitis tube was closed - birth  defect, caused problems with anesthesia  . Hyperlipemia   . Hypertension   . Migraine   . Pneumococcal infection    pt states she has had it 2 times   . PONV (postoperative nausea and vomiting)      Past Surgical History:  Procedure Laterality Date  . CESAREAN SECTION    . HYSTEROSCOPY N/A 06/18/2016   Procedure: DX HYSTEROSCOPY  with removal of embedded IUD;  Surgeon: Olivia Mackie, MD;  Location: [facility];  Service: Gynecology;  Laterality: N/A;  1hr.  . IUD REMOVAL  2017  . TUBAL LIGATION    . Tubal ligation reversal       Family History  Problem Relation Age of Onset  . CAD Father 75       CABG, died 15  . Heart attack Father   . Hypertension Father   . Hyperlipidemia Father   . Hypertension Mother   . Clotting disorder Mother   . Diabetes Sister   . Hypertension Sister   . Hepatitis C Sister   . COPD Sister   . Stroke Maternal Grandmother   . Alcohol abuse Maternal Grandfather   . Stroke Paternal Grandmother   . Stroke Paternal Grandfather   . Heart attack Paternal Grandfather      Social History   Substance and Sexual Activity  Drug Use No     Social History   Substance and Sexual Activity  Alcohol Use Yes   Comment: occassionally (1x per month)     Social History  Tobacco Use  Smoking Status Passive Smoke Exposure - Never Smoker  Smokeless Tobacco Never Used     Outpatient Encounter Medications as of 02/06/2019  Medication Sig  . b complex vitamins tablet Take 1 tablet by mouth daily.  . Coenzyme Q10 (CO Q 10) 10 MG CAPS Take 1 tablet by mouth daily.   Marland Kitchen lisinopril-hydrochlorothiazide (PRINZIDE,ZESTORETIC) 20-25 MG tablet Take 1 tablet by mouth daily.  . Multiple Vitamin (MULTIVITAMIN WITH MINERALS) TABS Take 1 tablet by mouth daily.  . Naltrexone-buPROPion HCl ER 8-90 MG TB12 Take 1 tablet by mouth 2 (two) times daily.  Marland Kitchen azithromycin (ZITHROMAX) 250 MG tablet 2 tabs day one. 1 tab days two-five  . benzonatate (TESSALON) 200 MG capsule  Take 1 capsule (200 mg total) by mouth 2 (two) times daily as needed for cough.  . predniSONE (DELTASONE) 20 MG tablet Take 2 tabs daily with breakfast, take for 5 days   No facility-administered encounter medications on file as of 02/06/2019.     Allergies: Hydrocodone-acetaminophen  Body mass index is 34.45 kg/m.  Blood pressure (!) 142/90, pulse 71, temperature (!) 97.5 F (36.4 C), temperature source Oral, height 5\' 3"  (1.6 m), weight 194 lb 8 oz (88.2 kg), SpO2 96 %. Review of Systems  Constitutional: Positive for activity change, appetite change and fatigue. Negative for chills, diaphoresis, fever and unexpected weight change.  HENT: Positive for sore throat. Negative for congestion, dental problem, postnasal drip, sinus pressure, sinus pain, sneezing and trouble swallowing.   Eyes: Negative for visual disturbance.  Respiratory: Positive for cough, chest tightness, shortness of breath and wheezing.   Cardiovascular: Negative for chest pain, palpitations and leg swelling.  Gastrointestinal: Negative for abdominal distention, abdominal pain, blood in stool, constipation, diarrhea, nausea and vomiting.  Endocrine: Negative for cold intolerance, heat intolerance, polydipsia, polyphagia and polyuria.  Genitourinary: Negative for difficulty urinating and flank pain.  Neurological: Negative for dizziness and headaches.  Psychiatric/Behavioral: Positive for sleep disturbance.       Objective:   Physical Exam Vitals signs and nursing note reviewed.  Constitutional:      Appearance: She is obese. She is ill-appearing. She is not diaphoretic.  HENT:     Head: Normocephalic and atraumatic.     Right Ear: No decreased hearing noted. Tympanic membrane is not erythematous or bulging.     Left Ear: No decreased hearing noted. Tympanic membrane is not erythematous or bulging.     Nose: Rhinorrhea present.     Right Turbinates: Swollen.     Left Turbinates: Swollen.     Right Sinus: No  maxillary sinus tenderness or frontal sinus tenderness.     Left Sinus: No maxillary sinus tenderness or frontal sinus tenderness.     Mouth/Throat:     Pharynx: Posterior oropharyngeal erythema present. No oropharyngeal exudate.     Tonsils: No tonsillar exudate. Swelling: 1+ on the right. 1+ on the left.  Eyes:     Extraocular Movements: Extraocular movements intact.     Conjunctiva/sclera: Conjunctivae normal.     Pupils: Pupils are equal, round, and reactive to light.  Neck:     Musculoskeletal: Normal range of motion.  Cardiovascular:     Rate and Rhythm: Normal rate.     Pulses: Normal pulses.     Heart sounds: No murmur. No friction rub. No gallop.   Pulmonary:     Breath sounds: Examination of the right-upper field reveals wheezing. Examination of the left-upper field reveals wheezing. Examination of the right-middle field  reveals wheezing. Examination of the left-middle field reveals wheezing. Examination of the right-lower field reveals decreased breath sounds. Examination of the left-lower field reveals decreased breath sounds. Decreased breath sounds and wheezing present. No rhonchi or rales.  Chest:     Chest wall: No tenderness.  Musculoskeletal: Normal range of motion.  Lymphadenopathy:     Cervical: No cervical adenopathy.  Skin:    General: Skin is warm and dry.     Capillary Refill: Capillary refill takes less than 2 seconds.  Neurological:     Mental Status: She is alert and oriented to person, place, and time.  Psychiatric:        Mood and Affect: Mood normal.        Behavior: Behavior normal.        Thought Content: Thought content normal.        Judgment: Judgment normal.       Assessment & Plan:   1. Cough in adult   2. 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 Keep masks on and proceed to nearest testing site. Please take medications as directed. Remain home as directed. Please call with any questions/concerns.    FOLLOW-UP:  Return if symptoms worsen or fail to improve.

## 2019-02-06 NOTE — Patient Instructions (Addendum)
Coronavirus (COVID-19) Are you at risk?  Are you at risk for the Coronavirus (COVID-19)?  To be considered HIGH RISK for Coronavirus (COVID-19), you have to meet the following criteria:  . Traveled to [place], [place], [place], [place] or [place]; or in the [place] to [place], [place], [place], or [place]; and have fever, cough, and shortness of breath within the last 2 weeks of travel OR . Been in close contact with a person diagnosed with COVID-19 within the last 2 weeks and have fever, cough, and shortness of breath . IF YOU DO NOT MEET THESE CRITERIA, YOU ARE CONSIDERED LOW RISK FOR COVID-19.  What to do if you are HIGH RISK for COVID-19?  . If you are having a medical emergency, call 911. . Seek medical care right away. Before you go to a doctor's office, urgent care or emergency department, call ahead and tell them about your recent travel, contact with someone diagnosed with COVID-19, and your symptoms. You should receive instructions from your physician's office regarding next steps of care.  . When you arrive at healthcare provider, tell the healthcare staff immediately you have returned from visiting [place], [place], [place], [place] or [place]; or traveled in the [place] to [place], [place], [place], or [place]; in the last two weeks or you have been in close contact with a person diagnosed with COVID-19 in the last 2 weeks.   . Tell the health care staff about your symptoms: fever, cough and shortness of breath. . After you have been seen by a medical provider, you will be either: o Tested for (COVID-19) and discharged home on quarantine except to seek medical care if symptoms worsen, and asked to  - Stay home and avoid contact with others until you get your results (4-5 days)  - Avoid travel on public transportation if possible (such as bus, train, or airplane) or o Sent to the Emergency Department by EMS for evaluation, COVID-19 testing, and possible  admission depending on your condition and test results.  What to do if you are LOW RISK for COVID-19?  Reduce your risk of any infection by using the same precautions used for avoiding the common cold or flu:  . Wash your hands often with soap and warm water for at least 20 seconds.  If soap and water are not readily available, use an alcohol-based hand sanitizer with at least 60% alcohol.  . If coughing or sneezing, cover your mouth and nose by coughing or sneezing into the elbow areas of your shirt or coat, into a tissue or into your sleeve (not your hands). . Avoid shaking hands with others and consider head nods or verbal greetings only. . Avoid touching your eyes, nose, or mouth with unwashed hands.  . Avoid close contact with people who are sick. . Avoid places or events with large numbers of people in one location, like concerts or sporting events. . Carefully consider travel plans you have or are making. . If you are planning any travel outside or inside the [place], visit the CDC's Travelers' Health webpage for the latest health notices. . If you have some symptoms but not all symptoms, continue to monitor at home and seek medical attention if your symptoms worsen. . If you are having a medical emergency, call 911.   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 Keep masks on and proceed to nearest testing site. Please take medications as directed. Remain home as directed. Please call with any questions/concerns. FEEL  BETTER!

## 2019-02-06 NOTE — Assessment & Plan Note (Signed)
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 Keep masks on and proceed to nearest testing site. Please take medications as directed. Remain home as directed. Please call with any questions/concerns.

## 2019-02-09 ENCOUNTER — Encounter: Payer: Self-pay | Admitting: Adult Health

## 2019-02-13 ENCOUNTER — Encounter: Payer: Self-pay | Admitting: Adult Health

## 2019-02-14 ENCOUNTER — Encounter: Payer: Self-pay | Admitting: Adult Health

## 2019-02-15 ENCOUNTER — Encounter: Payer: Self-pay | Admitting: Adult Health

## 2019-02-16 ENCOUNTER — Encounter: Payer: Self-pay | Admitting: Adult Health

## 2019-02-17 ENCOUNTER — Encounter: Payer: Self-pay | Admitting: Adult Health

## 2019-02-20 ENCOUNTER — Encounter: Payer: Self-pay | Admitting: Adult Health

## 2019-02-21 ENCOUNTER — Encounter: Payer: Self-pay | Admitting: Adult Health

## 2019-02-21 LAB — NOVEL CORONAVIRUS, NAA: SARS-CoV-2, NAA: NOT DETECTED

## 2019-03-20 ENCOUNTER — Encounter: Payer: Self-pay | Admitting: Adult Health

## 2019-03-20 ENCOUNTER — Ambulatory Visit (INDEPENDENT_AMBULATORY_CARE_PROVIDER_SITE_OTHER): Payer: BLUE CROSS/BLUE SHIELD | Admitting: Adult Health

## 2019-03-20 ENCOUNTER — Other Ambulatory Visit: Payer: Self-pay

## 2019-03-20 VITALS — BP 132/86 | Temp 97.8°F | Wt 179.0 lb

## 2019-03-20 DIAGNOSIS — Z6831 Body mass index (BMI) 31.0-31.9, adult: Secondary | ICD-10-CM | POA: Diagnosis not present

## 2019-03-20 DIAGNOSIS — G43009 Migraine without aura, not intractable, without status migrainosus: Secondary | ICD-10-CM | POA: Diagnosis not present

## 2019-03-20 MED ORDER — LISINOPRIL-HYDROCHLOROTHIAZIDE 20-25 MG PO TABS: 1.0000 | ORAL_TABLET | Freq: Every day | ORAL | 0 refills | Status: AC

## 2019-03-20 MED ORDER — NALTREXONE-BUPROPION HCL ER 8-90 MG PO TB12: 1.0000 | ORAL_TABLET | Freq: Two times a day (BID) | ORAL | 0 refills | Status: DC

## 2019-03-20 NOTE — Progress Notes (Signed)
Virtual Visit via Telephone Note  I connected with Tamara Frazier on 03/20/19 at 10:15 AM EDT by telephone and verified that I am speaking with the correct person using two identifiers.   I discussed the limitations, risks, security and privacy concerns of performing an evaluation and management service by telephone and the availability of in person appointments. The staff discussed with the patient that there may be a patient responsible charge related to this service. The patient expressed understanding and agreed to proceed.  Location of Patient- Home Location of Provider- In Clinic   History of Present Illness: 12/21/2018 OV: Tamara Frazier is here for f/u: anxiety/depression, HTN, Medical Wt Loss She was started on Naltrexone-Bupropion Oct 2019 She developed abdominal pain, dx'd with acute diverticulitis 09/20/2018- treated with ABX and referral to GI- denies acute GI sx's currently She has lost 12 lbs since last OV- GREAT! She reports stable mood and sig decrease in HAs She walks 7,500- 12,000  Steps/day She has been following Mediterranean diet She has been drinking >90 oz water/day She denies tobacco/vape/excessive ETOH use Current wt 183, goal <160 She also reports intermittent non-productive cough the last 3 weeks She denies fever/chills, known, exposure to influenza  03/20/2019 OV: Tamara Frazier calls in today for f/u: Medical Weight Loss She has completed first three month course of Naltrexone/Bupropion Current Wt 179, again goal <160 Her starting weight was 203 Oct 2019- she has lost total 24 lbs thus far- GREAT JOB! 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 Migraine without aura and without status migrainosus, not intractable 03/20/2019  . Shortness of breath 02/06/2019  . Cough in adult 12/21/2018  . Dyslipidemia 11/02/2018  . Aortic atherosclerosis (HCC) 11/02/2018  . Abnormal ECG 11/02/2018  . Healthcare maintenance 01/19/2018  . Chronic midline low back pain without sciatica 01/19/2018  . BMI 31.0-31.9,adult 01/19/2018  . Chest pain 11/05/2014  . HTN (hypertension) 11/05/2014  . Hyperlipidemia 11/05/2014     Past Medical History:  Diagnosis Date  . Blood transfusion   . Complication of anesthesia   . Diverticulitis   . 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 Hyperlipemia   . Hypertension   . Migraine   . Pneumococcal infection    pt states she has had it 2 times   . PONV (postoperative nausea and vomiting)      Past Surgical History:  Procedure Laterality Date  . CESAREAN SECTION    . HYSTEROSCOPY N/A 06/18/2016   Procedure: DX  HYSTEROSCOPY  with removal of embedded IUD;  Surgeon: Olivia Mackieichard Taavon, MD;  Location: [facility];  Service: Gynecology;  Laterality: N/A;  1hr.  . IUD REMOVAL  2017  . TUBAL LIGATION    . Tubal ligation reversal       Family History  Problem Relation Age of Onset  . CAD Father 1239        CABG, died 2262  . Heart attack Father   . Hypertension Father   . Hyperlipidemia Father   . Hypertension Mother   . Clotting disorder Mother   . Diabetes Sister   . Hypertension Sister   . Hepatitis C Sister   . COPD Sister   . Stroke Maternal Grandmother   . Alcohol abuse Maternal Grandfather   . Stroke Paternal Grandmother   . Stroke Paternal Grandfather   . Heart attack Paternal Grandfather      Social History   Substance and Sexual Activity  Drug Use No     Social History   Substance and Sexual Activity  Alcohol Use Yes   Comment: occassionally (1x per month)     Social History   Tobacco Use  Smoking Status Passive Smoke Exposure - Never Smoker  Smokeless Tobacco Never Used     Outpatient Encounter Medications as of 03/20/2019  Medication Sig  . b complex vitamins tablet Take 1 tablet by mouth daily.  Marland Kitchen. lisinopril-hydrochlorothiazide (ZESTORETIC) 20-25 MG tablet Take 1 tablet by mouth daily.  . Multiple Vitamin (MULTIVITAMIN WITH MINERALS) TABS Take 1 tablet by mouth daily.  . Naltrexone-buPROPion HCl ER 8-90 MG TB12 Take 1 tablet by mouth 2 (two) times daily. LAST REFILL  . [DISCONTINUED] benzonatate (TESSALON) 200 MG capsule Take 1 capsule (200 mg total) by mouth 2 (two) times daily as needed for cough.  . [DISCONTINUED] lisinopril-hydrochlorothiazide (PRINZIDE,ZESTORETIC) 20-25 MG tablet Take 1 tablet by mouth daily.  . [DISCONTINUED] Naltrexone-buPROPion HCl ER 8-90 MG TB12 Take 1 tablet by mouth 2 (two) times daily.  . [DISCONTINUED] azithromycin (ZITHROMAX) 250 MG tablet 2 tabs day one. 1 tab days two-five  . [DISCONTINUED] Coenzyme Q10 (CO Q 10) 10 MG CAPS Take 1 tablet by mouth daily.   . [DISCONTINUED] predniSONE (DELTASONE) 20 MG tablet Take 2 tabs daily with breakfast, take for 5 days   No facility-administered encounter medications on file as of 03/20/2019.     Allergies: Hydrocodone-acetaminophen  Body mass index is 31.71  kg/m.  Blood pressure 132/86, temperature 97.8 F (36.6 C), temperature source Oral, weight 179 lb (81.2 kg).  Observations/Objective: No acute distress noted during telephone conversation  Assessment and Plan: Continue all medications as directed Remain well hydrated, follow Mediterranean diet Continue regular walking and Groove work-outs  [place] Controlled Substance Database reviewed- no aberrancies noted Final 3 months course of Naltrexone/Bupropion 8/90mg  BID- tolerating this dosage well- will continue on this regime  COVID-19 Education: Signs and symptoms of COVID-19 infection were discussed with pt and how to seek care for testing.  The importance of following the Stay at Home order, and when out- Social Distancing and wearing a facial mask were discussed today.  Follow Up Instructions: 6 months OV with labs    I discussed the assessment and treatment plan with the patient. The patient was provided an opportunity to ask questions and all were answered. The patient agreed with the plan and demonstrated an understanding of the instructions.   The patient was advised to call back or seek an in-person evaluation if the symptoms worsen  or if the condition fails to improve as anticipated.  I provided 22 minutes of non-face-to-face time during this encounter.   Julaine Fusi, NP

## 2019-03-20 NOTE — Assessment & Plan Note (Signed)
Assessment and Plan: Continue all medications as directed Remain well hydrated, follow Mediterranean diet Continue regular walking and Groove work-outs  [place] Controlled Substance Database reviewed- no aberrancies noted Final 3 months course of Naltrexone/Bupropion 8/90mg  BID- tolerating this dosage well- will continue on this regime  COVID-19 Education: Signs and symptoms of COVID-19 infection were discussed with pt and how to seek care for testing.  The importance of following the Stay at Home order, and when out- Social Distancing and wearing a facial mask were discussed today.  Follow Up Instructions: 6 months OV with labs    I discussed the assessment and treatment plan with the patient. The patient was provided an opportunity to ask questions and all were answered. The patient agreed with the plan and demonstrated an understanding of the instructions.   The patient was advised to call back or seek an in-person evaluation if the symptoms worsen or if the condition fails to improve as anticipated.

## 2019-11-13 DIAGNOSIS — Z20828 Contact with and (suspected) exposure to other viral communicable diseases: Secondary | ICD-10-CM | POA: Diagnosis not present

## 2019-11-21 DIAGNOSIS — U071 COVID-19: Secondary | ICD-10-CM | POA: Diagnosis not present

## 2019-12-19 ENCOUNTER — Encounter: Payer: Self-pay | Admitting: Adult Health

## 2020-04-01 DIAGNOSIS — F4320 Adjustment disorder, unspecified: Secondary | ICD-10-CM | POA: Diagnosis not present

## 2020-04-11 DIAGNOSIS — F4320 Adjustment disorder, unspecified: Secondary | ICD-10-CM | POA: Diagnosis not present

## 2020-04-25 DIAGNOSIS — I10 Essential (primary) hypertension: Secondary | ICD-10-CM | POA: Diagnosis not present

## 2020-04-25 DIAGNOSIS — Z6832 Body mass index (BMI) 32.0-32.9, adult: Secondary | ICD-10-CM | POA: Diagnosis not present

## 2020-04-25 DIAGNOSIS — Z8639 Personal history of other endocrine, nutritional and metabolic disease: Secondary | ICD-10-CM | POA: Diagnosis not present

## 2020-04-25 DIAGNOSIS — Z7689 Persons encountering health services in other specified circumstances: Secondary | ICD-10-CM | POA: Diagnosis not present

## 2020-04-25 DIAGNOSIS — E669 Obesity, unspecified: Secondary | ICD-10-CM | POA: Diagnosis not present

## 2020-07-10 DIAGNOSIS — F4320 Adjustment disorder, unspecified: Secondary | ICD-10-CM | POA: Diagnosis not present

## 2020-10-06 DIAGNOSIS — Z3202 Encounter for pregnancy test, result negative: Secondary | ICD-10-CM | POA: Diagnosis not present

## 2020-10-06 DIAGNOSIS — K5792 Diverticulitis of intestine, part unspecified, without perforation or abscess without bleeding: Secondary | ICD-10-CM | POA: Diagnosis not present

## 2020-10-06 DIAGNOSIS — R1032 Left lower quadrant pain: Secondary | ICD-10-CM | POA: Diagnosis not present

## 2020-10-06 DIAGNOSIS — R109 Unspecified abdominal pain: Secondary | ICD-10-CM | POA: Diagnosis not present

## 2020-10-10 DIAGNOSIS — K5792 Diverticulitis of intestine, part unspecified, without perforation or abscess without bleeding: Secondary | ICD-10-CM | POA: Diagnosis not present

## 2020-10-10 DIAGNOSIS — R1032 Left lower quadrant pain: Secondary | ICD-10-CM | POA: Diagnosis not present

## 2020-10-29 DIAGNOSIS — F4320 Adjustment disorder, unspecified: Secondary | ICD-10-CM | POA: Diagnosis not present

## 2020-11-05 DIAGNOSIS — F4320 Adjustment disorder, unspecified: Secondary | ICD-10-CM | POA: Diagnosis not present

## 2020-12-19 DIAGNOSIS — Z01812 Encounter for preprocedural laboratory examination: Secondary | ICD-10-CM | POA: Diagnosis not present

## 2020-12-19 DIAGNOSIS — Z20822 Contact with and (suspected) exposure to covid-19: Secondary | ICD-10-CM | POA: Diagnosis not present

## 2020-12-19 DIAGNOSIS — K219 Gastro-esophageal reflux disease without esophagitis: Secondary | ICD-10-CM | POA: Diagnosis not present

## 2020-12-23 DIAGNOSIS — R1032 Left lower quadrant pain: Secondary | ICD-10-CM | POA: Diagnosis not present

## 2020-12-23 DIAGNOSIS — I10 Essential (primary) hypertension: Secondary | ICD-10-CM | POA: Diagnosis not present

## 2020-12-23 DIAGNOSIS — K2970 Gastritis, unspecified, without bleeding: Secondary | ICD-10-CM | POA: Diagnosis not present

## 2020-12-23 DIAGNOSIS — K648 Other hemorrhoids: Secondary | ICD-10-CM | POA: Diagnosis not present

## 2020-12-23 DIAGNOSIS — R1013 Epigastric pain: Secondary | ICD-10-CM | POA: Diagnosis not present

## 2020-12-23 DIAGNOSIS — K219 Gastro-esophageal reflux disease without esophagitis: Secondary | ICD-10-CM | POA: Diagnosis not present

## 2020-12-23 DIAGNOSIS — K5730 Diverticulosis of large intestine without perforation or abscess without bleeding: Secondary | ICD-10-CM | POA: Diagnosis not present

## 2020-12-23 DIAGNOSIS — K640 First degree hemorrhoids: Secondary | ICD-10-CM | POA: Diagnosis not present

## 2020-12-23 DIAGNOSIS — B9681 Helicobacter pylori [H. pylori] as the cause of diseases classified elsewhere: Secondary | ICD-10-CM | POA: Diagnosis not present

## 2020-12-23 DIAGNOSIS — K2950 Unspecified chronic gastritis without bleeding: Secondary | ICD-10-CM | POA: Diagnosis not present

## 2021-02-06 DIAGNOSIS — Z6832 Body mass index (BMI) 32.0-32.9, adult: Secondary | ICD-10-CM | POA: Diagnosis not present

## 2021-02-06 DIAGNOSIS — E669 Obesity, unspecified: Secondary | ICD-10-CM | POA: Diagnosis not present

## 2021-02-06 DIAGNOSIS — I10 Essential (primary) hypertension: Secondary | ICD-10-CM | POA: Diagnosis not present

## 2021-02-06 DIAGNOSIS — Z8639 Personal history of other endocrine, nutritional and metabolic disease: Secondary | ICD-10-CM | POA: Diagnosis not present

## 2021-05-11 DIAGNOSIS — F4320 Adjustment disorder, unspecified: Secondary | ICD-10-CM | POA: Diagnosis not present

## 2022-07-10 ENCOUNTER — Emergency Department (HOSPITAL_BASED_OUTPATIENT_CLINIC_OR_DEPARTMENT_OTHER): Payer: Commercial Managed Care - PPO

## 2022-07-10 ENCOUNTER — Encounter (HOSPITAL_BASED_OUTPATIENT_CLINIC_OR_DEPARTMENT_OTHER): Payer: Self-pay | Admitting: Emergency Medicine

## 2022-07-10 ENCOUNTER — Emergency Department (HOSPITAL_BASED_OUTPATIENT_CLINIC_OR_DEPARTMENT_OTHER)
Admission: EM | Admit: 2022-07-10 | Discharge: 2022-07-10 | Disposition: A | Discharge status: Home / Self Care | Payer: Commercial Managed Care - PPO | Attending: Emergency Medicine | Admitting: Emergency Medicine

## 2022-07-10 DIAGNOSIS — S9032XA Contusion of left foot, initial encounter: Secondary | ICD-10-CM | POA: Diagnosis not present

## 2022-07-10 DIAGNOSIS — W232XXA Caught, crushed, jammed or pinched between a moving and stationary object, initial encounter: Secondary | ICD-10-CM | POA: Diagnosis not present

## 2022-07-10 DIAGNOSIS — S99922A Unspecified injury of left foot, initial encounter: Secondary | ICD-10-CM

## 2022-07-10 NOTE — ED Triage Notes (Signed)
Pt arrives pov, c/o LT foot pain after shutting foot in car door. Pt to triage in cam walker. Endorses ibuprofen at 1500

## 2022-07-10 NOTE — ED Provider Notes (Signed)
[facility] EMERGENCY DEPARTMENT Provider Note   CSN: [id number] Arrival date & time: 07/10/22  1806     History  Chief Complaint  Patient presents with   Foot Injury    Tamara Frazier is a 51 y.o. female.   Foot Injury Patient is a 51 year old female with no pertinent past medical history she is present emergency room today with left foot and ankle pain after he closed her foot in the door of her car.  She denies any bleeding.  She states she has good sensation.  No numbness or weakness.  She has been able to walk.  No other injuries.     Home Medications Prior to Admission medications   Medication Sig Start Date End Date Taking? Authorizing Provider  b complex vitamins tablet Take 1 tablet by mouth daily.    [provider]  lisinopril-hydrochlorothiazide (ZESTORETIC) 20-25 MG tablet Take 1 tablet by mouth daily. 03/20/19 03/19/20  William Hamburger D, NP  Multiple Vitamin (MULTIVITAMIN WITH MINERALS) TABS Take 1 tablet by mouth daily.    [provider]  Naltrexone-buPROPion HCl ER 8-90 MG TB12 Take 1 tablet by mouth 2 (two) times daily. LAST REFILL 03/20/19   William Hamburger D, NP      Allergies    Hydrocodone-acetaminophen    Review of Systems   Review of Systems  Physical Exam Updated Vital Signs BP (!) 150/74   Pulse 81   Temp 97.9 F (36.6 C) (Oral)   Resp 18   Ht 5\' 3"  (1.6 m)   Wt 75.8 kg   LMP 09/25/2018   SpO2 100%   BMI 29.58 kg/m  Physical Exam Vitals and nursing note reviewed.  Constitutional:      General: She is not in acute distress.    Appearance: Normal appearance. She is not ill-appearing.  HENT:     Head: Normocephalic and atraumatic.  Eyes:     General: No scleral icterus.       Right eye: No discharge.        Left eye: No discharge.     Conjunctiva/sclera: Conjunctivae normal.  Pulmonary:     Effort: Pulmonary effort is normal.     Breath sounds: No stridor.  Skin:    Comments: Small scant bruising to dorsum  of left foot small to the level of the MTP of the middle digit of the left foot.  There is a dime sized area of bruising.  No other significant tenderness.  Full range of motion of toes and ankle  Neurological:     Mental Status: She is alert and oriented to person, place, and time. Mental status is at baseline.     ED Results / Procedures / Treatments   Labs (all labs ordered are listed, but only abnormal results are displayed) Labs Reviewed - No data to display  EKG None  Radiology DG Ankle Complete Left  Result Date: 07/10/2022 CLINICAL DATA:  Closed foot in car door, pain EXAM: LEFT ANKLE COMPLETE - 3+ VIEW COMPARISON:  None Available. FINDINGS: There is no evidence of fracture, dislocation, or joint effusion. There is no evidence of arthropathy or other focal bone abnormality. Soft tissues are unremarkable. IMPRESSION: Negative. Electronically Signed   By: 07/12/2022 M.D.   On: 07/10/2022 19:14   DG Foot Complete Left  Result Date: 07/10/2022 CLINICAL DATA:  Left foot pain, closed in car door EXAM: LEFT FOOT - COMPLETE 3+ VIEW COMPARISON:  None Available. FINDINGS: Old healed 5th metatarsal  fracture with deformity. No acute fracture, subluxation or dislocation. Joint spaces maintained. Soft tissues are intact. IMPRESSION: No acute bony abnormality. Electronically Signed   By: Charlett Nose M.D.   On: 07/10/2022 19:14    Procedures Procedures    Medications Ordered in ED Medications - No data to display  ED Course/ Medical Decision Making/ A&P                           Medical Decision Making Amount and/or Complexity of Data Reviewed Radiology: ordered.   Crush injury to left foot.  I personally reviewed x-rays and do not appreciate any fractures.  Agree with radiology read no acute fracture.  Patient is distally neurovascularly intact.  Ambulatory.  Will discharge with Tylenol and Profen recommendations follow-up PCP   Final Clinical Impression(s) / ED  Diagnoses Final diagnoses:  Injury of left foot, initial encounter    Rx / DC Orders ED Discharge Orders     None         Gailen Shelter, [place] 07/10/22 1944    Virgina Norfolk, DO 07/10/22 2230

## 2022-07-10 NOTE — Discharge Instructions (Signed)
You do not have any fractures of your foot or ankle.  Tylenol ibuprofen instructions below  Please use Tylenol or ibuprofen for pain.  You may use 600 mg ibuprofen every 6 hours or 1000 mg of Tylenol every 6 hours.  You may choose to alternate between the 2.  This would be most effective.  Not to exceed 4 g of Tylenol within 24 hours.  Not to exceed 3200 mg ibuprofen 24 hours.

## 2022-08-09 NOTE — H&P (Signed)
Ms. Wigger is a 51 y.o. female here for Fx D+C , myosure resection of polyp and endometrial ablation ( Novasure) .   Pt with evidence of endometrial polyps    Saline  [place]   Endometrial polyp seen= 0.417 x 0.325 x 0.751 cm   Ut wnl          Endometrium=4.44 mm   Rt ov  Simple ov cyst= 2.17  cm     Lt ov not seen    Past Medical History:  has a past medical history of Hypertension.  Past Surgical History:  has a past surgical history that includes BTL; tubal reversal; surgery for embedded IUD; and LTCS. Family History: family history is not on file. Social History:  reports that she has never smoked. She has never used smokeless tobacco. She reports that she does not use drugs. OB/GYN History:  OB History       Gravida  5   Para  5   Term      Preterm      AB      Living  5        SAB      IAB      Ectopic      Molar      Multiple      Live Births  5             Allergies: is allergic to hydrocodone and hydrocodone-acetaminophen. Medications:   Current Outpatient Medications:    lisinopriL-hydroCHLOROthiazide (ZESTORETIC) 20-25 mg tablet, Take 1 tablet by mouth once daily, Disp: , Rfl:    progesterone (PROMETRIUM) 100 MG capsule, Take 1 capsule (100 mg total) by mouth at bedtime 3 days after ovulation insert progesterone vaginally and continue once nightly through first trimester (Patient not taking: Reported on 07/27/2022), Disp: 30 capsule, Rfl: 0   Review of Systems: General:                      No fatigue or weight loss Eyes:                           No vision changes Ears:                            No hearing difficulty Respiratory:                No cough or shortness of breath Pulmonary:                  No asthma or shortness of breath Cardiovascular:           No chest pain, palpitations, dyspnea on exertion Gastrointestinal:          No abdominal bloating, chronic diarrhea, constipations, masses, pain or hematochezia Genitourinary:              No hematuria, dysuria, abnormal vaginal discharge, pelvic pain, Menometrorrhagia Lymphatic:                   No swollen lymph nodes Musculoskeletal:         No muscle weakness Neurologic:                  No extremity weakness, syncope, seizure disorder Psychiatric:                  No history of depression, delusions or suicidal/homicidal  ideation      Exam:       Vitals:    07/27/22 1606  BP: 121/80  Pulse: 93      Body mass index is 31.24 kg/m.   WDWN white/female in NAD   Lungs: CTA  CV : RRR without murmur   Breast: exam done in sitting and lying position : No dimpling or retraction, no dominant mass, no spontaneous discharge, no axillary adenopathy Neck:  no thyromegaly Abdomen: soft , no mass, normal active bowel sounds,  non-tender, no rebound tenderness Pelvic: tanner stage 5 ,  External genitalia: vulva /labia no lesions Urethra: no prolapse Vagina: normal physiologic d/c Cervix: no lesions, no cervical motion tenderness   Uterus: normal size shape and contour, non-tender Adnexa: no mass,  non-tender   Rectovaginal:    Impression:    The primary encounter diagnosis was Menometrorrhagia. A diagnosis of Endometrial polyp was also pertinent to this visit.       Plan:  Pt has elected for a Fx D+C , myosure resection and Novasure endometrial ablation  Benefits and risks to surgery: The proposed benefit of the surgery has been discussed with the patient. The possible risks include, but are not limited to: organ injury to the bowel , bladder, ureters, and major blood vessels and nerves. There is a possibility of additional surgeries resulting from these injuries. There is also the risk of blood transfusion and the need to receive blood products during or after the procedure which may rarely lead to HIV or Hepatitis C infection. There is a risk of developing a deep venous thrombosis or a pulmonary embolism . There is the possibility of wound infection and also  anesthetic complications, even the rare possibility of death. The patient understands these risks and wishes to proceed.      No orders of the defined types were placed in this encounter.     No follow-ups on file.   Vilma Prader, MD

## 2022-08-10 ENCOUNTER — Encounter
Admission: RE | Admit: 2022-08-10 | Discharge: 2022-08-10 | Disposition: A | Discharge status: Home / Self Care | Payer: Commercial Managed Care - PPO | Source: Ambulatory Visit | Attending: Obstetrics and Gynecology | Admitting: Obstetrics and Gynecology

## 2022-08-10 ENCOUNTER — Other Ambulatory Visit: Payer: Self-pay

## 2022-08-10 HISTORY — DX: Pneumonia, unspecified organism: J18.9

## 2022-08-10 NOTE — Patient Instructions (Addendum)
Your procedure is scheduled on: August 19, 2022 THURSDAY Report to the Registration Desk on the 1st floor of the [facility]. To find out your arrival time, please call [phone] between 1PM - 3PM on: WEDNESDAY August 18, 2022 If your arrival time is 6:00 am, do not arrive prior to that time as the [facility] entrance doors do not open until 6:00 am.  REMEMBER: Instructions that are not followed completely may result in serious medical risk, up to and including death; or upon the discretion of your surgeon and anesthesiologist your surgery may need to be rescheduled.  Do not eat food after midnight the night before surgery.  No gum chewing, lozengers or hard candies.  You may however, drink CLEAR liquids up to 2 hours before you are scheduled to arrive for your surgery. Do not drink anything within 2 hours of your scheduled arrival time.  Clear liquids include: - water  - apple juice without pulp - gatorade (not RED colors) - black coffee or tea (Do NOT add milk or creamers to the coffee or tea) Do NOT drink anything that is not on this list.  In addition, your doctor has ordered for you to drink the provided  Ensure Pre-Surgery Clear Carbohydrate Drink  Drinking this carbohydrate drink up to two hours before surgery helps to reduce insulin resistance and improve patient outcomes. Please complete drinking 2 hours prior to scheduled arrival time.  TAKE THESE MEDICATIONS THE MORNING OF SURGERY WITH A SIP OF WATER: NONE  One week prior to surgery: Stop Anti-inflammatories (NSAIDS) such as Advil, Aleve, Ibuprofen, Motrin, Naproxen, Naprosyn and Aspirin based products such as Excedrin, Goodys Powder, BC Powder. Stop ANY OVER THE COUNTER supplements until after surgery. You may however, continue to take Tylenol if needed for pain up until the day of surgery.  No Alcohol for 24 hours before or after surgery.  No Smoking including e-cigarettes for 24 hours prior to surgery.   No chewable tobacco products for at least 6 hours prior to surgery.  No nicotine patches on the day of surgery.  Do not use any "recreational" drugs for at least a week prior to your surgery.  Please be advised that the combination of cocaine and anesthesia may have negative outcomes, up to and including death. If you test positive for cocaine, your surgery will be cancelled.  On the morning of surgery brush your teeth with toothpaste and water, you may rinse your mouth with mouthwash if you wish. Do not swallow any toothpaste or mouthwash.  Use CHG Soap as directed on instruction sheet.  Do not wear jewelry, make-up, hairpins, clips or nail polish.  Do not wear lotions, powders, or perfumes OR DEODORANT  Do not shave body from the neck down 48 hours prior to surgery just in case you cut yourself which could leave a site for infection.  Also, freshly shaved skin may become irritated if using the CHG soap.  Contact lenses, hearing aids and dentures may not be worn into surgery.  Do not bring valuables to the hospital. [facility] is not responsible for any missing/lost belongings or valuables.   Notify your doctor if there is any change in your medical condition (cold, fever, infection).  Wear comfortable clothing (specific to your surgery type) to the hospital.  After surgery, you can help prevent lung complications by doing breathing exercises.  Take deep breaths and cough every 1-2 hours. Your doctor may order a device called an Incentive Spirometer to help you  take deep breaths. When coughing or sneezing, hold a pillow firmly against your incision with both hands. This is called "splinting." Doing this helps protect your incision. It also decreases belly discomfort.  If you are being discharged the day of surgery, you will not be allowed to drive home. You will need a responsible adult (18 years or older) to drive you home and stay with you that night.   If you are taking  public transportation, you will need to have a responsible adult (18 years or older) with you. Please confirm with your physician that it is acceptable to use public transportation.   Please call the [facility] Dept. at [phone] if you have any questions about these instructions.  Surgery Visitation Policy:  Patients undergoing a surgery or procedure may have two family members or support persons with them as long as the person is not COVID-19 positive or experiencing its symptoms.

## 2022-08-11 ENCOUNTER — Inpatient Hospital Stay: Admission: RE | Admit: 2022-08-11 | Payer: Commercial Managed Care - PPO | Source: Ambulatory Visit

## 2022-08-13 ENCOUNTER — Encounter: Payer: Self-pay | Admitting: Urgent Care

## 2022-08-13 ENCOUNTER — Encounter
Admission: RE | Admit: 2022-08-13 | Discharge: 2022-08-13 | Disposition: A | Discharge status: Home / Self Care | Payer: Commercial Managed Care - PPO | Source: Ambulatory Visit | Attending: Obstetrics and Gynecology | Admitting: Obstetrics and Gynecology

## 2022-08-13 DIAGNOSIS — Z01812 Encounter for preprocedural laboratory examination: Secondary | ICD-10-CM | POA: Insufficient documentation

## 2022-08-13 DIAGNOSIS — Z01818 Encounter for other preprocedural examination: Secondary | ICD-10-CM

## 2022-08-13 LAB — TYPE AND SCREEN
ABO/RH(D): B POS
Antibody Screen: NEGATIVE

## 2022-08-13 LAB — BASIC METABOLIC PANEL
Anion gap: 9 (ref 5–15)
BUN: 26 mg/dL — ABNORMAL HIGH (ref 6–20)
CO2: 28 mmol/L (ref 22–32)
Calcium: 9.8 mg/dL (ref 8.9–10.3)
Chloride: 101 mmol/L (ref 98–111)
Creatinine, Ser: 0.96 mg/dL (ref 0.44–1.00)
GFR, Estimated: >60 mL/min (ref >60)
Glucose, Bld: 90 mg/dL (ref 70–99)
Potassium: 3.4 mmol/L — ABNORMAL LOW (ref 3.5–5.1)
Sodium: 138 mmol/L (ref 135–145)

## 2022-08-13 LAB — CBC
HCT: 41.4 % (ref 36.0–46.0)
Hemoglobin: 13.7 g/dL (ref 12.0–15.0)
MCH: 30.3 pg (ref 26.0–34.0)
MCHC: 33.1 g/dL (ref 30.0–36.0)
MCV: 91.6 fL (ref 80.0–100.0)
Platelets: 322 10*3/uL (ref 150–400)
RBC: 4.52 MIL/uL (ref 3.87–5.11)
RDW: 12.1 % (ref 11.5–15.5)
WBC: 6.4 10*3/uL (ref 4.0–10.5)
nRBC: 0 % (ref 0.0–0.2)

## 2022-08-17 ENCOUNTER — Other Ambulatory Visit: Payer: Self-pay | Admitting: Obstetrics and Gynecology

## 2022-08-18 MED ORDER — LACTATED RINGERS IV SOLN: INTRAVENOUS | Status: DC

## 2022-08-18 MED ORDER — POVIDONE-IODINE 10 % EX SWAB: 2.0000 | Freq: Once | CUTANEOUS | Status: DC

## 2022-08-18 MED ORDER — CEFAZOLIN SODIUM-DEXTROSE 2-4 GM/100ML-% IV SOLN
2.0000 g | Freq: Once | INTRAVENOUS | Status: AC
  Administered 2022-08-19: 2 g via INTRAVENOUS

## 2022-08-18 MED ORDER — GABAPENTIN 300 MG PO CAPS
300.0000 mg | ORAL_CAPSULE | ORAL | Status: AC
Start: 2022-08-19 — End: 2022-08-19

## 2022-08-18 MED ORDER — FAMOTIDINE 20 MG PO TABS: 20.0000 mg | ORAL_TABLET | Freq: Once | ORAL | Status: AC

## 2022-08-18 MED ORDER — ACETAMINOPHEN 500 MG PO TABS: 1000.0000 mg | ORAL_TABLET | ORAL | Status: AC

## 2022-08-19 ENCOUNTER — Encounter
Admission: RE | Disposition: A | Discharge status: Home / Self Care | Payer: Self-pay | Source: Home / Self Care | Attending: Obstetrics and Gynecology

## 2022-08-19 ENCOUNTER — Ambulatory Visit
Admission: RE | Admit: 2022-08-19 | Discharge: 2022-08-19 | Disposition: A | Discharge status: Home / Self Care | Payer: Commercial Managed Care - PPO | Attending: Obstetrics and Gynecology | Admitting: Obstetrics and Gynecology

## 2022-08-19 ENCOUNTER — Other Ambulatory Visit: Payer: Self-pay

## 2022-08-19 ENCOUNTER — Encounter: Payer: Self-pay | Admitting: Obstetrics and Gynecology

## 2022-08-19 ENCOUNTER — Ambulatory Visit: Payer: Commercial Managed Care - PPO | Admitting: Registered Nurse

## 2022-08-19 ENCOUNTER — Ambulatory Visit: Payer: Commercial Managed Care - PPO | Admitting: Urgent Care

## 2022-08-19 DIAGNOSIS — N840 Polyp of corpus uteri: Secondary | ICD-10-CM | POA: Insufficient documentation

## 2022-08-19 DIAGNOSIS — I10 Essential (primary) hypertension: Secondary | ICD-10-CM | POA: Insufficient documentation

## 2022-08-19 DIAGNOSIS — N921 Excessive and frequent menstruation with irregular cycle: Secondary | ICD-10-CM | POA: Diagnosis not present

## 2022-08-19 DIAGNOSIS — Z01818 Encounter for other preprocedural examination: Secondary | ICD-10-CM

## 2022-08-19 DIAGNOSIS — N920 Excessive and frequent menstruation with regular cycle: Secondary | ICD-10-CM | POA: Diagnosis not present

## 2022-08-19 HISTORY — PX: DILITATION & CURRETTAGE/HYSTROSCOPY WITH NOVASURE ABLATION: SHX5568

## 2022-08-19 LAB — POCT PREGNANCY, URINE: Preg Test, Ur: NEGATIVE

## 2022-08-19 SURGERY — DILATATION & CURETTAGE/HYSTEROSCOPY WITH NOVASURE ABLATION
Anesthesia: General | Site: Vagina

## 2022-08-19 MED ORDER — MIDAZOLAM HCL 2 MG/2ML IJ SOLN
INTRAMUSCULAR | Status: AC
  Filled 2022-08-19: qty 2

## 2022-08-19 MED ORDER — FENTANYL CITRATE (PF) 100 MCG/2ML IJ SOLN
25.0000 ug | INTRAMUSCULAR | Status: DC | PRN
  Administered 2022-08-19 (×3): 25 ug via INTRAVENOUS

## 2022-08-19 MED ORDER — LIDOCAINE-EPINEPHRINE 1 %-1:100000 IJ SOLN
INTRAMUSCULAR | Status: AC
  Filled 2022-08-19: qty 1

## 2022-08-19 MED ORDER — OXYCODONE HCL 5 MG PO TABS
5.0000 mg | ORAL_TABLET | ORAL | Status: DC | PRN
  Administered 2022-08-19: 5 mg via ORAL

## 2022-08-19 MED ORDER — SILVER NITRATE-POT NITRATE 75-25 % EX MISC
CUTANEOUS | Status: AC
  Filled 2022-08-19: qty 10

## 2022-08-19 MED ORDER — DEXMEDETOMIDINE HCL IN NACL 200 MCG/50ML IV SOLN
INTRAVENOUS | Status: DC | PRN
  Administered 2022-08-19 (×2): 4 ug via INTRAVENOUS

## 2022-08-19 MED ORDER — LACTATED RINGERS IV SOLN: INTRAVENOUS | Status: DC

## 2022-08-19 MED ORDER — DEXAMETHASONE SODIUM PHOSPHATE 10 MG/ML IJ SOLN
INTRAMUSCULAR | Status: AC
  Filled 2022-08-19: qty 1

## 2022-08-19 MED ORDER — KETOROLAC TROMETHAMINE 30 MG/ML IJ SOLN
INTRAMUSCULAR | Status: AC
  Filled 2022-08-19: qty 1

## 2022-08-19 MED ORDER — GLYCOPYRROLATE 0.2 MG/ML IJ SOLN
INTRAMUSCULAR | Status: DC | PRN
  Administered 2022-08-19: .2 mg via INTRAVENOUS

## 2022-08-19 MED ORDER — CHLORHEXIDINE GLUCONATE 0.12 % MT SOLN: 15.0000 mL | Freq: Once | OROMUCOSAL | Status: DC

## 2022-08-19 MED ORDER — PROPOFOL 10 MG/ML IV BOLUS
INTRAVENOUS | Status: AC
  Filled 2022-08-19: qty 20

## 2022-08-19 MED ORDER — PROMETHAZINE HCL 25 MG/ML IJ SOLN: 6.2500 mg | INTRAMUSCULAR | Status: DC | PRN

## 2022-08-19 MED ORDER — FENTANYL CITRATE (PF) 100 MCG/2ML IJ SOLN
INTRAMUSCULAR | Status: DC | PRN
  Administered 2022-08-19 (×2): 25 ug via INTRAVENOUS

## 2022-08-19 MED ORDER — FAMOTIDINE 20 MG PO TABS
ORAL_TABLET | ORAL | Status: AC
  Administered 2022-08-19: 20 mg via ORAL
  Filled 2022-08-19: qty 1

## 2022-08-19 MED ORDER — OXYCODONE HCL 5 MG PO TABS
ORAL_TABLET | ORAL | Status: AC
  Filled 2022-08-19: qty 1

## 2022-08-19 MED ORDER — ONDANSETRON HCL 4 MG/2ML IJ SOLN
INTRAMUSCULAR | Status: DC | PRN
  Administered 2022-08-19: 4 mg via INTRAVENOUS

## 2022-08-19 MED ORDER — GABAPENTIN 300 MG PO CAPS
ORAL_CAPSULE | ORAL | Status: AC
  Administered 2022-08-19: 300 mg via ORAL
  Filled 2022-08-19: qty 1

## 2022-08-19 MED ORDER — GLYCOPYRROLATE 0.2 MG/ML IJ SOLN
INTRAMUSCULAR | Status: AC
  Filled 2022-08-19: qty 1

## 2022-08-19 MED ORDER — ONDANSETRON HCL 4 MG/2ML IJ SOLN
INTRAMUSCULAR | Status: AC
  Filled 2022-08-19: qty 2

## 2022-08-19 MED ORDER — MIDAZOLAM HCL 2 MG/2ML IJ SOLN
INTRAMUSCULAR | Status: DC | PRN
  Administered 2022-08-19: 2 mg via INTRAVENOUS

## 2022-08-19 MED ORDER — KETOROLAC TROMETHAMINE 30 MG/ML IJ SOLN
INTRAMUSCULAR | Status: DC | PRN
  Administered 2022-08-19: 30 mg via INTRAVENOUS

## 2022-08-19 MED ORDER — ACETAMINOPHEN 500 MG PO TABS
ORAL_TABLET | ORAL | Status: AC
  Administered 2022-08-19: 1000 mg via ORAL
  Filled 2022-08-19: qty 2

## 2022-08-19 MED ORDER — FENTANYL CITRATE (PF) 100 MCG/2ML IJ SOLN
INTRAMUSCULAR | Status: AC
  Filled 2022-08-19: qty 2

## 2022-08-19 MED ORDER — ORAL CARE MOUTH RINSE: 15.0000 mL | Freq: Once | OROMUCOSAL | Status: DC

## 2022-08-19 MED ORDER — PROPOFOL 10 MG/ML IV BOLUS
INTRAVENOUS | Status: DC | PRN
  Administered 2022-08-19: 150 mg via INTRAVENOUS

## 2022-08-19 MED ORDER — LIDOCAINE HCL (PF) 2 % IJ SOLN
INTRAMUSCULAR | Status: AC
  Filled 2022-08-19: qty 5

## 2022-08-19 MED ORDER — FENTANYL CITRATE (PF) 100 MCG/2ML IJ SOLN
INTRAMUSCULAR | Status: AC
  Administered 2022-08-19: 25 ug via INTRAVENOUS
  Filled 2022-08-19: qty 2

## 2022-08-19 MED ORDER — LIDOCAINE HCL (CARDIAC) PF 100 MG/5ML IV SOSY
PREFILLED_SYRINGE | INTRAVENOUS | Status: DC | PRN
  Administered 2022-08-19: 80 mg via INTRAVENOUS

## 2022-08-19 MED ORDER — CHLORHEXIDINE GLUCONATE 0.12 % MT SOLN
OROMUCOSAL | Status: AC
Start: 2022-08-19 — End: 2022-08-19
  Administered 2022-08-19: 15 mL
  Filled 2022-08-19: qty 15

## 2022-08-19 MED ORDER — CEFAZOLIN SODIUM-DEXTROSE 2-4 GM/100ML-% IV SOLN
INTRAVENOUS | Status: AC
Start: 2022-08-19 — End: 2022-08-19
  Filled 2022-08-19: qty 100

## 2022-08-19 MED ORDER — DEXAMETHASONE SODIUM PHOSPHATE 10 MG/ML IJ SOLN
INTRAMUSCULAR | Status: DC | PRN
  Administered 2022-08-19: 10 mg via INTRAVENOUS

## 2022-08-19 SURGICAL SUPPLY — 27 items
ABLATOR SURESOUND NOVASURE (ABLATOR) IMPLANT
BAG PRESSURE INF REUSE 1000 (BAG) ×1 IMPLANT
DEVICE MYOSURE LITE (MISCELLANEOUS) IMPLANT
DEVICE MYOSURE REACH (MISCELLANEOUS) IMPLANT
DRSG TELFA 3X8 NADH STRL (GAUZE/BANDAGES/DRESSINGS) ×1 IMPLANT
GLOVE SURG SYN 8.0 (GLOVE) ×1 IMPLANT
GLOVE SURG SYN 8.0 PF PI (GLOVE) ×1 IMPLANT
GOWN STRL REUS W/ TWL LRG LVL3 (GOWN DISPOSABLE) ×1 IMPLANT
GOWN STRL REUS W/ TWL XL LVL3 (GOWN DISPOSABLE) ×1 IMPLANT
GOWN STRL REUS W/TWL LRG LVL3 (GOWN DISPOSABLE) ×1
GOWN STRL REUS W/TWL XL LVL3 (GOWN DISPOSABLE) ×1
IV NS 1000ML (IV SOLUTION) ×1
IV NS 1000ML BAXH (IV SOLUTION) ×1 IMPLANT
IV NS IRRIG 3000ML ARTHROMATIC (IV SOLUTION) ×1 IMPLANT
KIT PROCEDURE FLUENT (KITS) IMPLANT
KIT TURNOVER CYSTO (KITS) ×1 IMPLANT
MANIFOLD NEPTUNE II (INSTRUMENTS) ×1 IMPLANT
PACK DNC HYST (MISCELLANEOUS) IMPLANT
PAD OB MATERNITY 4.3X12.25 (PERSONAL CARE ITEMS) ×1 IMPLANT
PAD PREP 24X41 OB/GYN DISP (PERSONAL CARE ITEMS) ×1 IMPLANT
SCRUB CHG 4% DYNA-HEX 4OZ (MISCELLANEOUS) ×1 IMPLANT
SEAL ROD LENS SCOPE MYOSURE (ABLATOR) ×1 IMPLANT
SET CYSTO W/LG BORE CLAMP LF (SET/KITS/TRAYS/PACK) IMPLANT
TOWEL OR 17X26 4PK STRL BLUE (TOWEL DISPOSABLE) ×1 IMPLANT
TRAP FLUID SMOKE EVACUATOR (MISCELLANEOUS) ×1 IMPLANT
TUBING CONNECTING 10 (TUBING) IMPLANT
WATER STERILE IRR 500ML POUR (IV SOLUTION) ×1 IMPLANT

## 2022-08-19 NOTE — Brief Op Note (Signed)
08/19/2022  9:45 AM  PATIENT:  Tamara Frazier  51 y.o. female  PRE-OPERATIVE DIAGNOSIS:  menorrhagia, endometrial polyp  POST-OPERATIVE DIAGNOSIS:  menorrhagia, endometrial polyp  PROCEDURE: Fractional dilation and curettage  Myosure resection of endometrial ablation   SURGEON:  Surgeon(s) and Role:    * [place] Paster, Gwen Her, MD - Primary  PHYSICIAN ASSISTANT:   ASSISTANTS: none   ANESTHESIA:   spinal  EBL:  qbl 5 cc, IOF 500cc uo 75 cc   BLOOD ADMINISTERED:none  DRAINS: none   LOCAL MEDICATIONS USED:  NONE  SPECIMEN:  Source of Specimen:  ecc , Endometrial polyps and endometrial curettings   DISPOSITION OF SPECIMEN:  PATHOLOGY  COUNTS:  YES  TOURNIQUET:  * No tourniquets in log *  DICTATION: .Other Dictation: Dictation Number verbal  PLAN OF CARE: Discharge to home after PACU  PATIENT DISPOSITION:  PACU - hemodynamically stable.   Delay start of Pharmacological VTE agent (>24hrs) due to surgical blood loss or risk of bleeding: not applicable

## 2022-08-19 NOTE — Transfer of Care (Signed)
Immediate Anesthesia Transfer of Care Note  Patient: Tamara Frazier  Procedure(s) Performed: Procedure(s) with comments: FRACTIONAL DILATATION & CURETTAGE/HYSTEROSCOPY, MYOSURE RESECTION OF ENDOMETRIAL POLYP, NOVASURE ABLATION ABORTED (N/A) - [place] DUE TO PATIENT ANATOMY  Patient Location: PACU  Anesthesia Type:General  Level of Consciousness: sedated  Airway & Oxygen Therapy: Patient Spontanous Breathing and Patient connected to face mask oxygen  Post-op Assessment: Report given to RN and Post -op Vital signs reviewed and stable  Post vital signs: Reviewed and stable  Last Vitals:  Vitals:   08/19/22 0757 08/19/22 1000  BP: 126/75 133/78  Pulse: 82 93  Resp: 12 14  Temp: 36.4 C 36.6 C  SpO2: 14% 709%    Complications: No apparent anesthesia complications

## 2022-08-19 NOTE — Anesthesia Procedure Notes (Signed)
Procedure Name: LMA Insertion Date/Time: 08/19/2022 9:05 AM  Performed by: Doreen Salvage, CRNAPre-anesthesia Checklist: Patient identified, Patient being monitored, Timeout performed, Emergency Drugs available and Suction available Patient Re-evaluated:Patient Re-evaluated prior to induction Oxygen Delivery Method: Circle system utilized Preoxygenation: Pre-oxygenation with 100% oxygen Induction Type: IV induction Ventilation: Mask ventilation without difficulty LMA: LMA inserted LMA Size: 4.0 Tube type: Oral Number of attempts: 1 Placement Confirmation: positive ETCO2 and breath sounds checked- equal and bilateral Tube secured with: Tape Dental Injury: Teeth and Oropharynx as per pre-operative assessment

## 2022-08-19 NOTE — Anesthesia Postprocedure Evaluation (Signed)
Anesthesia Post Note  Patient: Tamara Frazier  Procedure(s) Performed: FRACTIONAL DILATATION & CURETTAGE/HYSTEROSCOPY, MYOSURE RESECTION OF ENDOMETRIAL POLYP, NOVASURE ABLATION ABORTED (Vagina )  Patient location during evaluation: PACU Anesthesia Type: General Level of consciousness: awake and alert Pain management: pain level controlled Vital Signs Assessment: post-procedure vital signs reviewed and stable Respiratory status: spontaneous breathing, nonlabored ventilation and respiratory function stable Cardiovascular status: blood pressure returned to baseline and stable Postop Assessment: no apparent nausea or vomiting Anesthetic complications: no   No notable events documented.   Last Vitals:  Vitals:   08/19/22 1030 08/19/22 1047  BP: 122/76 129/71  Pulse: 77 71  Resp: 14 15  Temp: 36.5 C 36.6 C  SpO2: 96% 98%    Last Pain:  Vitals:   08/19/22 1047  TempSrc: Temporal  PainSc: [place]

## 2022-08-19 NOTE — Op Note (Signed)
NAME: Tamara Frazier, Tamara Frazier MEDICAL RECORD NO: [id number] ACCOUNT NO: [id number] DATE OF BIRTH: 11/01/1971 FACILITY: [facility] LOCATION: [facility] PHYSICIAN: Boykin Nearing, MD  Operative Report   DATE OF PROCEDURE: 08/19/2022   PREOPERATIVE DIAGNOSIS:   1.  Menometrorrhagia. 2.  Endometrial polyps.   POSTOPERATIVE DIAGNOSIS:    1.  Menometrorrhagia. 2.  Endometrial polyps.    PROCEDURE:   1.  Fractional dilation and curettage. 2.  MyoSure resection of endometrial polyps. 3.  NovaSure procedure aborted due to the patient's anatomic conditions.  SURGEON:  Boykin Nearing, MD  FIRST ASSISTANT:  CST.  ANESTHESIA:  General endotracheal anesthesia.  INDICATIONS:  A 51 year old gravida 5, para 5, patient with abnormal uterine bleeding consistent with menometrorrhagia.  Saline infusion sonohysterography in the office demonstrated endometrial polyp.  DESCRIPTION OF PROCEDURE:  After adequate general endotracheal anesthesia, the patient was placed in dorsal supine position with the legs in the candy cane stirrups.  The patient's perineum and vagina were prepped and draped in normal sterile fashion.   The patient did receive 2 grams of IV Ancef prior to commencement of the case.  The patient's timeout was performed.  A weighted speculum was placed in the posterior vaginal vault and the anterior cervix was grasped with single tooth tenaculum.  Bladder  was drained with a straight catheter yielding 75 mL clear urine.  Endocervical curettage was then performed followed by uterine sounding to 8 cm.  Cervix was dilated to #16 Hanks dilators followed by placement of the hysteroscope into the endometrial  cavity.  Normal saline was used with the Fluent system to distend the endometrial cavity.  Three separate small endometrial polyps were identified.  The MyoSure resector was brought up to the operative field and the 3 polyps were resected without  difficulty.  Good hemostasis was noted.   Hysteroscope was removed followed by dilation to #19 Hanks dilators and sharp curettage was performed with a small amount of additional tissue.  The NovaSure ablator was brought up to the operative field and was  advanced into the endometrial cavity.  The array was opened; however, could not be opened past 2 cm; therefore, the cavity assessment test could not be performed.  Therefore, the ablation could not be completed due to the patient's anatomic  considerations.  Multiple attempts were tried to get the array opened past 2.5 cm, which were all unsuccessful.  This portion of the procedure was aborted.  Single tooth tenaculum was removed.  Good hemostasis was noted.  Of note, the patient does have a  cystocele, rectocele and uterine descensus on exam.  ESTIMATED BLOOD LOSS:  5 mL.  INTRAOPERATIVE FLUIDS:  500 mL  URINE OUTPUT:  75 mL.   The patient was taken to recovery room in good condition.     [email or web address] D: 08/19/2022 10:20:59 am T: 08/19/2022 11:33:00 am  JOB: [id number]/ [id number]

## 2022-08-19 NOTE — Progress Notes (Signed)
Pt here for Fx D+C Myosure removal of endometrial polyp and Novasure ablation . LAbs revuewed all questions answered . Proceed

## 2022-08-19 NOTE — Anesthesia Preprocedure Evaluation (Signed)
Anesthesia Evaluation  Patient identified by MRN, date of birth, ID band Patient awake    Reviewed: Allergy & Precautions, H&P , NPO status , Patient's Chart, lab work & pertinent test results, reviewed documented beta blocker date and time   History of Anesthesia Complications (+) PONV, Family history of anesthesia reaction and history of anesthetic complications  Airway Mallampati: II  TM Distance: >3 FB Neck ROM: full    Dental  (+) Dental Advidsory Given, Missing, Teeth Intact   Pulmonary neg pulmonary ROS,    Pulmonary exam normal breath sounds clear to auscultation       Cardiovascular Exercise Tolerance: Good hypertension, (-) angina(-) Past MI and (-) Cardiac Stents Normal cardiovascular exam(-) dysrhythmias (-) Valvular Problems/Murmurs Rhythm:regular Rate:Normal     Neuro/Psych negative neurological ROS  negative psych ROS   GI/Hepatic Neg liver ROS, GERD  ,  Endo/Other  negative endocrine ROS  Renal/GU negative Renal ROS  negative genitourinary   Musculoskeletal   Abdominal   Peds  Hematology negative hematology ROS (+)   Anesthesia Other Findings Past Medical History: No date: Blood transfusion No date: Complication of anesthesia No date: Diverticulitis 1990: Family history of adverse reaction to anesthesia     Comment:  pt's father pancreatitis tube was closed - birth defect,              caused problems with anesthesia No date: Hyperlipemia No date: Hypertension No date: Migraine No date: Pneumococcal infection     Comment:  pt states she has had it 2 times  No date: Pneumonia No date: PONV (postoperative nausea and vomiting)   Reproductive/Obstetrics negative OB ROS                             Anesthesia Physical Anesthesia Plan  ASA: 2  Anesthesia Plan: General   Post-op Pain Management:    Induction: Intravenous  PONV Risk Score and Plan: 4 or greater and  Ondansetron, Dexamethasone, Midazolam, Treatment may vary due to age or medical condition and Promethazine  Airway Management Planned: LMA  Additional Equipment:   Intra-op Plan:   Post-operative Plan: Extubation in OR  Informed Consent: I have reviewed the patients History and Physical, chart, labs and discussed the procedure including the risks, benefits and alternatives for the proposed anesthesia with the patient or authorized representative who has indicated his/her understanding and acceptance.     Dental Advisory Given  Plan Discussed with: Anesthesiologist, CRNA and Surgeon  Anesthesia Plan Comments:         Anesthesia Quick Evaluation

## 2022-08-19 NOTE — Discharge Instructions (Addendum)
AMBULATORY SURGERY  DISCHARGE INSTRUCTIONS   The drugs that you were given will stay in your system until tomorrow so for the next 24 hours you should not:  Drive an automobile Make any legal decisions Drink any alcoholic beverage   You may resume regular meals tomorrow.  Today it is better to start with liquids and gradually work up to solid foods.  You may eat anything you prefer, but it is better to start with liquids, then soup and crackers, and gradually work up to solid foods.   Please notify your doctor immediately if you have any unusual bleeding, trouble breathing, redness and pain at the surgery site, drainage, fever, or pain not relieved by medication.    Additional Instructions: TYLENOL WAS GIVEN HERE AT 800 am AND A NSAID (SAME DRUG AS MOTRIN/IBUPROFEN) WAS GIVEN AT 940 AM   Please contact your physician with any problems or Same Day Surgery at [phone], Monday through Friday 6 am to 4 pm, or [place] at [facility] number at [phone].

## 2022-08-20 LAB — SURGICAL PATHOLOGY

## 2022-12-30 ENCOUNTER — Other Ambulatory Visit (HOSPITAL_BASED_OUTPATIENT_CLINIC_OR_DEPARTMENT_OTHER): Payer: Self-pay | Admitting: Nurse Practitioner

## 2022-12-30 DIAGNOSIS — E049 Nontoxic goiter, unspecified: Secondary | ICD-10-CM

## 2023-06-28 ENCOUNTER — Other Ambulatory Visit: Payer: Self-pay | Admitting: Nurse Practitioner

## 2023-06-28 DIAGNOSIS — Z1231 Encounter for screening mammogram for malignant neoplasm of breast: Secondary | ICD-10-CM

## 2023-07-01 ENCOUNTER — Ambulatory Visit
Admission: RE | Admit: 2023-07-01 | Discharge: 2023-07-01 | Disposition: A | Discharge status: Home / Self Care | Payer: Commercial Managed Care - PPO | Source: Ambulatory Visit | Attending: Nurse Practitioner | Admitting: Nurse Practitioner

## 2023-07-01 DIAGNOSIS — Z1231 Encounter for screening mammogram for malignant neoplasm of breast: Secondary | ICD-10-CM

## 2023-07-05 ENCOUNTER — Encounter: Payer: Self-pay | Admitting: Neurology

## 2023-07-12 ENCOUNTER — Other Ambulatory Visit: Payer: Self-pay | Admitting: *Deleted

## 2023-07-12 DIAGNOSIS — M5416 Radiculopathy, lumbar region: Secondary | ICD-10-CM

## 2023-07-12 DIAGNOSIS — M4716 Other spondylosis with myelopathy, lumbar region: Secondary | ICD-10-CM

## 2023-07-26 NOTE — Progress Notes (Unsigned)
Assessment/Plan:   Tremor  -very little tremor seen on examination today  -Reassured her that I saw no evidence of Parkinsons disease.  -Discussed medicines for tremor, but think that risks outweigh benefits in her and she does not disagree.  -Discussed reasons that we would want to see her back, including bothersome and increasing tremor, falls, gait instability and any other progressive symptoms that she may note.  -Check TSH today  -Follow-up as needed  Subjective:   Tamara Frazier was seen today in the movement disorders clinic for neurologic consultation at the request of Julianne Handler, NP.  The consultation is for the evaluation of tremor.  Outside records that were made available to me were reviewed.   Tremor: Yes.     How long has it been going on? 3-5 years ago she had slight tremor with focus on things but over the last year, she noted more tremor with holding something and more recently she will have tremor at random times  At rest or with activation?  activation  Fam hx of tremor?  Yes.  in her mother (has ET, s/p dbs)  Located where?  R hand (she is R handed)  Affected by caffeine:  unknown ("I drink lots" - "I love starbucks" - does macchiato and cold brew)  Affected by alcohol:  unknown(maybe 1 time per week)  Affected by stress:  Yes.  , possibly  Affected by fatigue:  perhaps  Spills soup if on spoon:  No.  Spills glass of liquid if full:  No.  Affects ADL's (tying shoes, brushing teeth, etc):  No.  Tremor inducing meds:  No.  Other Specific Symptoms:  Voice: no change Sleep: light sleeper - with "nerve pill" and "centrum for menopause" she is sleeping better  Vivid Dreams:  No.  Acting out dreams:  No. Wet Pillows: No. Postural symptoms:  No.  Falls?  No. Bradykinesia symptoms: difficulty getting out of a chair (mild due to back issues/sciatica); no shuffle Loss of smell:  No. Loss of taste:  No. Urinary Incontinence:  No. Difficulty Swallowing:   No. Handwriting, micrographia: No. Depression:  No., on wellbutrin for menopause and that has helped "edginess" Memory changes:  No. N/V:  No. Lightheaded:  occasionally - has had it as a child  Syncope: Yes.  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   She had a CT brain in 2018 for HA that was unremarkable  PREVIOUS MEDICATIONS: noone  ALLERGIES:  No Known Allergies  CURRENT MEDICATIONS:  Current Outpatient Medications  Medication Instructions   b complex vitamins tablet 1 tablet, Oral, Daily   buPROPion (WELLBUTRIN XL) 150 mg, Oral, Daily   lisinopril-hydrochlorothiazide (ZESTORETIC) 20-25 MG tablet 1 tablet, Oral, Daily   Multiple Vitamin (MULTIVITAMIN WITH MINERALS) TABS 1 tablet, Daily    Objective:   PHYSICAL EXAMINATION:    VITALS:   Vitals:   07/28/23 0953  BP: 118/78  Pulse: 72  SpO2: 98%  Weight: 188 lb (85.3 kg)  Height: 5\' 3"  (1.6 m)    GEN:  The patient appears stated age and is in NAD. HEENT:  Normocephalic, atraumatic.  The mucous membranes are moist. The superficial temporal arteries are without ropiness or tenderness. CV:  RRR Lungs:  CTAB Neck/HEME:  There are no carotid bruits bilaterally.  Neurological examination:  Orientation: The patient is alert and oriented x3.  Cranial nerves: There is good facial symmetry.  Extraocular muscles are intact. The visual fields are full  to confrontational testing. The speech is fluent and clear. Soft palate rises symmetrically and there is no tongue deviation. Hearing is intact to conversational tone. Sensation: Sensation is intact to light touch throughout (facial, trunk, extremities). Vibration is intact at the bilateral big toe. There is no extinction with double simultaneous stimulation.  Motor: Strength is 5/5 in the bilateral upper and lower extremities.   Shoulder shrug is equal and symmetric.  There is no pronator drift. Deep tendon reflexes: Deep tendon reflexes are 2/4 at the bilateral  biceps, triceps, brachioradialis, patella and achilles. Plantar responses are downgoing bilaterally.  Movement examination: Tone: There is nl tone in the bilateral upper extremities.  The tone in the lower extremities is nl.  Abnormal movements: no rest tremor.  No postural tremor.  No intention tremor.  Very little tremor when asked to pour water from 1 glass to another.  No significant trouble with Archimedes spirals.     Coordination:  There is no decremation with RAM's, with any form of RAMS, including alternating supination and pronation of the forearm, hand opening and closing, finger taps, heel taps and toe taps.  Gait and Station: The patient has no difficulty arising out of a deep-seated chair without the use of the hands. The patient's stride length is good.   I have reviewed and interpreted the following labs independently   Chemistry      Component Value Date/Time   NA 138 08/13/2022 1313   NA 139 01/30/2018 0931   K 3.4 (L) 08/13/2022 1313   CL 101 08/13/2022 1313   CO2 28 08/13/2022 1313   BUN 26 (H) 08/13/2022 1313   BUN 17 01/30/2018 0931   CREATININE 0.96 08/13/2022 1313   GLU 76 07/01/2017 0000      Component Value Date/Time   CALCIUM 9.8 08/13/2022 1313   ALKPHOS 97 09/20/2018 1031   AST 18 09/20/2018 1031   ALT 17 09/20/2018 1031   BILITOT 1.1 09/20/2018 1031   BILITOT <0.2 01/30/2018 0931      Lab Results  Component Value Date   TSH 3.330 01/30/2018   Lab Results  Component Value Date   WBC 6.4 08/13/2022   HGB 13.7 08/13/2022   HCT 41.4 08/13/2022   MCV 91.6 08/13/2022   PLT 322 08/13/2022      Total time spent on today's visit was 61 minutes, including both face-to-face time and nonface-to-face time.  Time included that spent on review of records (prior notes available to me/labs/imaging if pertinent), discussing treatment and goals, answering patient's questions and coordinating care.  Cc:  Physicians, [facility]

## 2023-07-28 ENCOUNTER — Encounter: Payer: Self-pay | Admitting: Neurology

## 2023-07-28 ENCOUNTER — Ambulatory Visit: Payer: Commercial Managed Care - PPO | Admitting: Neurology

## 2023-07-28 ENCOUNTER — Other Ambulatory Visit (INDEPENDENT_AMBULATORY_CARE_PROVIDER_SITE_OTHER): Payer: Commercial Managed Care - PPO

## 2023-07-28 VITALS — BP 118/78 | HR 72 | Ht 63.0 in | Wt 188.0 lb

## 2023-07-28 DIAGNOSIS — R251 Tremor, unspecified: Secondary | ICD-10-CM | POA: Diagnosis not present

## 2023-07-28 LAB — TSH: TSH: 3.6 u[IU]/mL (ref 0.35–5.50)

## 2023-07-28 NOTE — Patient Instructions (Addendum)
Your provider has requested that you have labwork completed today. The lab is located on the Second floor at [address], within the [facility] office. When you get off the elevator, turn right and go in the [facility] [address]; the first brown door on the left.  Tell the ladies behind the desk that you are there for lab work. If you are not called within 15 minutes please check with the front desk.   Once you complete your labs you are free to go. You will receive a call or message via MyChart with your lab results.    It was good to see you today!  The physicians and staff at [facility] are committed to providing excellent care. You may receive a survey requesting feedback about your experience at our office. We strive to receive "very good" responses to the survey questions. If you feel that your experience would prevent you from giving the office a "very good " response, please contact our office to try to remedy the situation. We may be reached at [phone]. Thank you for taking the time out of your busy day to complete the survey.

## 2023-08-09 ENCOUNTER — Ambulatory Visit
Admission: RE | Admit: 2023-08-09 | Discharge: 2023-08-09 | Disposition: A | Discharge status: Home / Self Care | Payer: Commercial Managed Care - PPO | Source: Ambulatory Visit | Attending: *Deleted | Admitting: *Deleted

## 2023-08-09 DIAGNOSIS — M4716 Other spondylosis with myelopathy, lumbar region: Secondary | ICD-10-CM

## 2023-08-09 DIAGNOSIS — M5416 Radiculopathy, lumbar region: Secondary | ICD-10-CM

## 2023-08-19 ENCOUNTER — Encounter: Payer: Self-pay | Admitting: Neurology

## 2023-10-10 ENCOUNTER — Ambulatory Visit: Payer: Commercial Managed Care - PPO

## 2023-10-10 ENCOUNTER — Ambulatory Visit
Admission: EM | Admit: 2023-10-10 | Discharge: 2023-10-10 | Disposition: A | Discharge status: Home / Self Care | Payer: Commercial Managed Care - PPO | Attending: Internal Medicine | Admitting: Internal Medicine

## 2023-10-10 DIAGNOSIS — S5011XA Contusion of right forearm, initial encounter: Secondary | ICD-10-CM | POA: Diagnosis not present

## 2023-10-10 MED ORDER — NAPROXEN 375 MG PO TABS: 375.0000 mg | ORAL_TABLET | Freq: Two times a day (BID) | ORAL | 0 refills | Status: DC

## 2023-10-10 NOTE — ED Triage Notes (Signed)
Pt states she fel going up steps yesterday-pain to right forearm-last ibuprofen yesterday-NAD-steady gait

## 2023-10-10 NOTE — ED Provider Notes (Addendum)
[facility] - [facility]RG[facility]  Note:  This document was prepared using Conservation officer, historic buildings and may include unintentional dictation errors.  MRN: [id number] DOB: Aug 14, 1971  Subjective:   Tamara Frazier is a 52 y.o. female presenting for 1 day history of persistent right forearm pain, focal swelling, knot sensation.  Patient fell going up the steps accidentally and made impact against her right forearm.  Has not taken any medications today but used ibuprofen yesterday.  No current facility-administered medications for this encounter.  Current Outpatient Medications:    b complex vitamins tablet, Take 1 tablet by mouth daily., Disp: , Rfl:    buPROPion (WELLBUTRIN XL) 150 MG 24 hr tablet, Take 150 mg by mouth daily., Disp: , Rfl:    lisinopril-hydrochlorothiazide (ZESTORETIC) 20-25 MG tablet, Take 1 tablet by mouth daily., Disp: 90 tablet, Rfl: 0   Multiple Vitamin (MULTIVITAMIN WITH MINERALS) TABS, Take 1 tablet by mouth daily., Disp: , Rfl:    No Known Allergies  Past Medical History:  Diagnosis Date   Blood transfusion    Complication of anesthesia    Diverticulitis    Family history of adverse reaction to anesthesia 1990   pt's father pancreatitis tube was closed - birth defect, caused problems with anesthesia   Hyperlipemia    Hypertension    Migraine    Pneumococcal infection    pt states she has had it 2 times    Pneumonia    PONV (postoperative nausea and vomiting)      Past Surgical History:  Procedure Laterality Date   CESAREAN SECTION     COLONOSCOPY     DILITATION & CURRETTAGE/HYSTROSCOPY WITH NOVASURE ABLATION N/A 08/19/2022   Procedure: FRACTIONAL DILATATION & CURETTAGE/HYSTEROSCOPY, MYOSURE RESECTION OF ENDOMETRIAL POLYP, NOVASURE ABLATION ABORTED;  Surgeon: Schermerhorn, Ihor Austin, MD;  Location: [facility] ORS;  Service: Gynecology;  Laterality: N/A;  NOVASURE ABLATION ABORTED DUE TO PATIENT ANATOMY   HYSTEROSCOPY N/A 06/18/2016   Procedure: DX  HYSTEROSCOPY  with removal of embedded IUD;  Surgeon: Olivia Mackie, MD;  Location: [facility] OR[facility];  Service: Gynecology;  Laterality: N/A;  1hr.   IUD REMOVAL  2017   TUBAL LIGATION     Tubal ligation reversal      Family History  Problem Relation Age of Onset   Tremor Mother    Hypertension Mother    Clotting disorder Mother    CAD Father 80       CABG, died 10   Heart attack Father    Hypertension Father    Hyperlipidemia Father    Diabetes Sister    Hypertension Sister    Hepatitis C Sister    COPD Sister    Stroke Maternal Grandmother    Tremor Maternal Grandmother    Alcohol abuse Maternal Grandfather    Stroke Paternal Grandmother    Stroke Paternal Grandfather    Heart attack Paternal Grandfather    Breast cancer Maternal Aunt        41s    Social History   Tobacco Use   Smoking status: Never    Passive exposure: Yes   Smokeless tobacco: Never  Vaping Use   Vaping status: Never Used  Substance Use Topics   Alcohol use: Yes    Alcohol/week: 1.0 standard drink of alcohol    Types: 1 Glasses of wine per week    Comment: occassionally   Drug use: No    ROS   Objective:   Vitals: BP (!) 141/87 (BP Location: Right  Arm)   Pulse 65   Temp 97.8 F (36.6 C) (Oral)   Resp 20   LMP 09/25/2018   SpO2 95%   Physical Exam Constitutional:      General: She is not in acute distress.    Appearance: Normal appearance. She is well-developed. She is not ill-appearing, toxic-appearing or diaphoretic.  HENT:     Head: Normocephalic and atraumatic.     Nose: Nose normal.     Mouth/Throat:     Mouth: Mucous membranes are moist.  Eyes:     General: No scleral icterus.       Right eye: No discharge.        Left eye: No discharge.     Extraocular Movements: Extraocular movements intact.  Cardiovascular:     Rate and Rhythm: Normal rate.  Pulmonary:     Effort: Pulmonary effort is normal.  Musculoskeletal:     Right elbow: No swelling, deformity, effusion or  lacerations. Normal range of motion. No tenderness.     Right forearm: Swelling (focal) and tenderness (over area outlined with trace ecchymosis) present. No edema, deformity, lacerations or bony tenderness.       Arms:  Skin:    General: Skin is warm and dry.  Neurological:     General: No focal deficit present.     Mental Status: She is alert and oriented to person, place, and time.  Psychiatric:        Mood and Affect: Mood normal.        Behavior: Behavior normal.      Assessment and Plan :   PDMP not reviewed this encounter.  1. Contusion of right forearm, initial encounter    X-ray over-read was pending at time of discharge, recommended follow up with only abnormal results. Otherwise will not call for negative over-read. Patient was in agreement. Recommended conservative management for right forearm contusion.  Use RICE method, naproxen for pain and inflammation. Counseled patient on potential for adverse effects with medications prescribed/recommended today, ER and return-to-clinic precautions discussed, patient verbalized understanding.    Wallis Bamberg, PA-C 10/10/23 1211

## 2024-03-16 ENCOUNTER — Ambulatory Visit: Admission: EM | Admit: 2024-03-16 | Discharge: 2024-03-16 | Disposition: A | Discharge status: Home / Self Care

## 2024-03-16 DIAGNOSIS — N921 Excessive and frequent menstruation with irregular cycle: Secondary | ICD-10-CM | POA: Insufficient documentation

## 2024-03-16 DIAGNOSIS — U071 COVID-19: Secondary | ICD-10-CM | POA: Diagnosis not present

## 2024-03-16 DIAGNOSIS — J209 Acute bronchitis, unspecified: Secondary | ICD-10-CM

## 2024-03-16 LAB — POC SARS CORONAVIRUS 2 AG -  ED: SARS Coronavirus 2 Ag: POSITIVE — AB

## 2024-03-16 MED ORDER — PREDNISONE 20 MG PO TABS: 40.0000 mg | ORAL_TABLET | Freq: Every day | ORAL | 0 refills | Status: AC

## 2024-03-16 MED ORDER — PROMETHAZINE-DM 6.25-15 MG/5ML PO SYRP: 5.0000 mL | ORAL_SOLUTION | Freq: Four times a day (QID) | ORAL | 0 refills | Status: AC | PRN

## 2024-03-16 MED ORDER — ALBUTEROL SULFATE HFA 108 (90 BASE) MCG/ACT IN AERS
2.0000 | INHALATION_SPRAY | Freq: Once | RESPIRATORY_TRACT | Status: AC
  Administered 2024-03-16: 2 via RESPIRATORY_TRACT

## 2024-03-16 NOTE — Discharge Instructions (Signed)
 As long as you feel okay I am fine with you attending the conference given the worse of your symptoms have subsided and you have remained afebrile.  Suggest wearing a mask to prevent infecting others or possibly acquiring a secondary respiratory illness. Recommend taking an antihistamine such as cetirizine or Xyzal to help prevent any coughing or congestion symptoms associated with outdoor allergens as the pollen count is expected increase over the weekend.

## 2024-03-16 NOTE — H&P (Signed)
 Tamara Frazier is a 53 y.o. female here for Fractional D+C hysteroscopy possible myosure resection of polyp  H/O PMB ( labs c/w 2023 ) 9/23: s/p ablation attempt ( aborted to her anatomy ) Fx D+C and resection of polyp done Path 9/23 : A.  ENDOMETRIUM AND POLYP; CURETTINGS AND RESECTION:  - FRAGMENTS OF WEAKLY PROLIFERATIVE ENDOMETRIUM AND ENDOMETRIAL POLYP.  - NEGATIVE FOR ATYPIA/EIN AND MALIGNANCY.   B.  ENDOCERVICAL CURETTINGS:  - MUCO-INFLAMMATORY DEBRIS WITH SCANT STRIPS OF BENIGN ENDOCERVICAL AND  ENDOMETRIAL EPITHELIUM.  - NEGATIVE FOR ATYPIA AND MALIGNANCY.    History of Present Illness Tamara Frazier is a 53 year old female who presents with thickened endometrial lining and spotting.   She has a thickened endometrial lining and persistent spotting, which has continued despite previous interventions. She is seeking further evaluation and management options.   A previous attempt at endometrial ablation was unsuccessful due to anatomical challenges in positioning the array. During that procedure, a polyp was removed, but the ablation could not be completed, leaving her with ongoing symptoms.   Recent evaluations revealed proliferative findings with a thickened endometrial lining. A biopsy was performed, which returned negative for atypia, hyperplasia, or cancer.  There is a noted association between her weight and the thickened lining, as excess adipose tissue can lead to increased estrogen production, contributing to endometrial thickening.   EMBX : 02/21/24 in office  [facility] Comment  Comment: Part A-Endometrial Biopsy: DISORDERED PROLIFERATIVE PHASE ENDOMETRIUM. NO HYPERPLASIA OR CARCINOMA.    Pap still pending    Past Medical History:  has a past medical history of Hypertension.  Past Surgical History:  has a past surgical history that includes BTL; tubal reversal; surgery for embedded IUD; LTCS; and Fractional D&C, Myosure and Novasure (08/19/2022). Family History: family history  is not on file. Social History:  reports that she has never smoked. She has never used smokeless tobacco. She reports that she does not use drugs. OB/GYN History:  OB History       Gravida  5   Para  5   Term      Preterm      AB      Living  5        SAB      IAB      Ectopic      Molar      Multiple      Live Births  5             Allergies: has No Known Allergies. Medications:  Current Medications    Current Outpatient Medications:    buPROPion  (WELLBUTRIN  XL) 150 MG XL tablet, Take 150 mg by mouth once daily, Disp: , Rfl:    celecoxib (CELEBREX) 200 MG capsule, Take 200 mg by mouth once daily, Disp: , Rfl:    ibuprofen  (MOTRIN ) 800 MG tablet, Take 1 tablet (800 mg total) by mouth every 8 (eight) hours as needed for Pain, Disp: 30 tablet, Rfl: 1   lisinopriL -hydroCHLOROthiazide  (ZESTORETIC ) 20-25 mg tablet, Take 1 tablet by mouth once daily, Disp: , Rfl:    ondansetron  (ZOFRAN ) 8 MG tablet, Take 1 tablet (8 mg total) by mouth every 8 (eight) hours as needed (Patient not taking: Reported on 02/28/2024), Disp: 12 tablet, Rfl: 0   oxyCODONE -acetaminophen  (PERCOCET) 5-325 mg tablet, Take 1 tablet by mouth every 8 (eight) hours as needed for Pain (Patient not taking: Reported on 02/28/2024), Disp: 9 tablet, Rfl: 0   progesterone (PROMETRIUM) 100 MG  capsule, Take 1 capsule (100 mg total) by mouth at bedtime 3 days after ovulation insert progesterone vaginally and continue once nightly through first trimester (Patient not taking: Reported on 07/27/2022), Disp: 30 capsule, Rfl: 0   progesterone (PROMETRIUM) 100 MG capsule, Take 2 tabs po q hs x 30 hs then take 1 tab po q hs x 60 hs (Patient not taking: Reported on 02/28/2024), Disp: 120 capsule, Rfl: 0     Review of Systems: General:                      No fatigue or weight loss Eyes:                           No vision changes Ears:                            No hearing difficulty Respiratory:                No cough or  shortness of breath Pulmonary:                  No asthma or shortness of breath Cardiovascular:           No chest pain, palpitations, dyspnea on exertion Gastrointestinal:          No abdominal bloating, chronic diarrhea, constipations, masses, pain or hematochezia Genitourinary:             No hematuria, dysuria, abnormal vaginal discharge, pelvic pain, Menometrorrhagia Lymphatic:                   No swollen lymph nodes Musculoskeletal:No muscle weakness Neurologic:                  No extremity weakness, syncope, seizure disorder Psychiatric:                  No history of depression, delusions or suicidal/homicidal ideation      Exam:       Vitals:    03/20/24  1111  BP: 124/73  Pulse: 65      Body mass index is 33.13 kg/m.   WDWN white/ female in NAD   Lungs: CTA  CV : RRR without murmur   Abdomen: soft , no mass, normal active bowel sounds,  non-tender, no rebound tenderness Pelvic: tanner stage 5 ,  External genitalia: vulva /labia no lesions Urethra: no prolapse Vagina: normal physiologic d/c Cervix: no lesions, no cervical motion tenderness   Uterus: normal size shape and contour, non-tender Adnexa: no mass,  non-tender   Pelvic exam done  Chaperone present   Rectovaginal:    Saline infusion sonohysterography: betadine  prep to the cervix. Tenaculum placed on anterior cervix and dilation of canal performed .  placement of the HSG catheter into the endometrial canal . Sterile H2O is injected while performing a transvaginal u/s . Findings:  Uterus ======   Visualized. Size 95 mm x 67 mm x 56 mm enlarged Position: anteverted Malformations: none Myometrium: Heterogeneous, suspicion of adenomyosis Endometrium: uniform echogenicity: hyperechogenic. Endometrial thickness, total 8.6 mm Cervix details: nabothian cysts visualized Fibroid(s) 1.  Size 17.00 mm x 11.00 mm x 14 mm. Mean 14.0 mm. Vol 1.4 cm. Left lateral wall. FIGO - 4 (intramural no             endometrial or serosal involvement)  2.  Size 17.00 mm x 12.00 mm x 18 mm. Mean 15.7 mm. Vol 1.9 cm. Right lateral wall. FIGO - 4 (intramural no            endometrial or serosal involvement)   Right Ovary =========   Visualized. Size 38 mm x 34 mm x 34 mm Cyst(s)    Size 25.0 mm x 25.0 mm x 28 mm. Mean 26.0 mm. Vol 9.163 cm. Unilocular, anechoic. No vascularity is noted on color Doppler.   Left Ovary ========   Visualized. Outline: smooth. Morphology: postmenopausal atrophic. Size 17 mm x 11 mm x 6 mm   Cul de Sac =========   Visualized. No free fluid visualized      The sonogram reveals heterogenous uterus with multiple fibroids.   Postmenopausal endometrial thickening.   Right ovarian cystic interface is noted. Please see details above.   Saline ultrasound   No endometrial distension was achieved with use of saline.        Impression:    The primary encounter diagnosis was PMB (postmenopausal bleeding). A diagnosis of Thickened endometrium was also pertinent to this visit.       Plan:    Assessment & Plan Thickened endometrial lining Thickened endometrial lining with proliferative findings, negative biopsy for atypia or hyperplasia, no cancer indication. Possible excess estrogen from adipose tissue.    - Schedule D&C for thorough sampling and evaluation. - Consider progesterone therapy to decrease endometrial buildup and manage spotting. - Discuss weight loss to reduce estrogen production and endometrial thickening.   - Evaluate endometrial lining during D&C to determine bleeding cause. -.  Risks of the procedure discussed . See [facility]  notes

## 2024-03-16 NOTE — ED Notes (Signed)
 Unable to scan med. Scanner (not charged), Unplugged and recharging.

## 2024-03-16 NOTE — ED Provider Notes (Signed)
 [facility]    CSN: [id number] Arrival date & time: 03/16/24  0957      History   Chief Complaint Chief Complaint  Patient presents with   Nasal Congestion   Cough    HPI Tamara Frazier is a 53 y.o. female.  Patient here today for evaluation of nasal congestion, cough, and had generalized bodyaches, severe fatigue for the last 3 days.  Reports today besides the cough she has felt slightly better however is concerned as she is attending a conference this weekend and wanted to make sure that she was safe to travel.  Denies any shortness of breath but endorses some increased work of breathing after having cycles of coughing.  Patient endorses that she is prone to bronchitis when she develops upper respiratory symptoms.  Reports no history of asthma but was exposed to secondary smoking as a child.  Past Medical History:  Diagnosis Date   Blood transfusion    Complication of anesthesia    Diverticulitis    Family history of adverse reaction to anesthesia 1990   pt's father pancreatitis tube was closed - birth defect, caused problems with anesthesia   Hyperlipemia    Hypertension    Migraine    Pneumococcal infection    pt states she has had it 2 times    Pneumonia    PONV (postoperative nausea and vomiting)     Patient Active Problem List   Diagnosis Date Noted   Menometrorrhagia 03/16/2024   Migraine without aura and without status migrainosus, not intractable 03/20/2019   Shortness of breath 02/06/2019   Cough in adult 12/21/2018   Dyslipidemia 11/02/2018   Aortic atherosclerosis (HCC) 11/02/2018   Abnormal ECG 11/02/2018   Healthcare maintenance 01/19/2018   Chronic midline low back pain without sciatica 01/19/2018   BMI 31.0-31.9,adult 01/19/2018   Chest pain 11/05/2014   Migraines 08/28/2012   HTN (hypertension) 07/31/2012   Hyperlipidemia 07/31/2012    Past Surgical History:  Procedure Laterality Date   CESAREAN SECTION     COLONOSCOPY      DILITATION & CURRETTAGE/HYSTROSCOPY WITH NOVASURE ABLATION N/A 08/19/2022   Procedure: FRACTIONAL DILATATION & CURETTAGE/HYSTEROSCOPY, MYOSURE RESECTION OF ENDOMETRIAL POLYP, NOVASURE ABLATION ABORTED;  Surgeon: Schermerhorn, Joselyn Nicely, MD;  Location: [facility];  Service: Gynecology;  Laterality: N/A;  NOVASURE ABLATION ABORTED DUE TO PATIENT ANATOMY   HYSTEROSCOPY N/A 06/18/2016   Procedure: DX HYSTEROSCOPY  with removal of embedded IUD;  Surgeon: Meriam Stamp, MD;  Location: [facility];  Service: Gynecology;  Laterality: N/A;  1hr.   IUD REMOVAL  2017   TUBAL LIGATION     Tubal ligation reversal      OB History     Gravida  6   Para  5   Term  5   Preterm      AB      Living  5      SAB      IAB      Ectopic      Multiple      Live Births  1            Home Medications    Prior to Admission medications   Medication Sig Start Date End Date Taking? Authorizing Provider  baclofen (LIORESAL) 10 MG tablet Take 5-10 mg by mouth 3 (three) times daily as needed. 10/28/23  Yes [provider]  celecoxib (CELEBREX) 200 MG capsule Take 200 mg by mouth daily. 07/01/23  Yes [provider]  ibuprofen  (ADVIL ) 800 MG tablet Take 800 mg by mouth every 8 (eight) hours as needed.   Yes [provider]  potassium chloride  (KLOR-CON  M) 10 MEQ tablet Take 10 mEq by mouth daily. 02/29/24  Yes [provider]  predniSONE  (DELTASONE ) 20 MG tablet Take 2 tablets (40 mg total) by mouth daily with breakfast for 5 days. 03/16/24 03/21/24 Yes Buena Carmine, NP  pregabalin (LYRICA) 50 MG capsule Take 50-100 mg by mouth at bedtime. 01/24/24  Yes [provider]  promethazine -dextromethorphan (PROMETHAZINE -DM) 6.25-15 MG/5ML syrup Take 5 mLs by mouth 4 (four) times daily as needed for cough (congestion and upper respiratory). 03/16/24  Yes Buena Carmine, NP  Pseudoeph-Doxylamine-DM-APAP (NYQUIL PO) Take by mouth.   Yes [provider]  Vitamin  D, Ergocalciferol, (DRISDOL) 1.25 MG (50000 UNIT) CAPS capsule Take 50,000 Units by mouth once a week. 02/13/24  Yes [provider]  b complex vitamins tablet Take 1 tablet by mouth daily.    [provider]  buPROPion  (WELLBUTRIN  XL) 150 MG 24 hr tablet Take 150 mg by mouth daily.    [provider]  lisinopril -hydrochlorothiazide  (ZESTORETIC ) 20-25 MG tablet Take 1 tablet by mouth daily. 03/20/19 08/19/22  Napoleon Backer D, NP  Multiple Vitamin (MULTIVITAMIN WITH MINERALS) TABS Take 1 tablet by mouth daily.    [provider]  naproxen  (NAPROSYN ) 375 MG tablet Take 1 tablet (375 mg total) by mouth 2 (two) times daily with a meal. 10/10/23   Adolph Hoop, PA-C    Family History Family History  Problem Relation Age of Onset   Tremor Mother    Hypertension Mother    Clotting disorder Mother    CAD Father 71       CABG, died 52   Heart attack Father    Hypertension Father    Hyperlipidemia Father    Diabetes Sister    Hypertension Sister    Hepatitis C Sister    COPD Sister    Stroke Maternal Grandmother    Tremor Maternal Grandmother    Alcohol abuse Maternal Grandfather    Stroke Paternal Grandmother    Stroke Paternal Grandfather    Heart attack Paternal Grandfather    Breast cancer Maternal Aunt        11s    Social History Social History   Tobacco Use   Smoking status: Never    Passive exposure: Yes   Smokeless tobacco: Never  Vaping Use   Vaping status: Never Used  Substance Use Topics   Alcohol use: Yes    Alcohol/week: 1.0 standard drink of alcohol    Types: 1 Glasses of wine per week    Comment: occassionally   Drug use: No     Allergies   Patient has no known allergies.   Review of Systems Review of Systems  Respiratory:  Positive for cough.      Physical Exam Triage Vital Signs ED Triage Vitals  Encounter Vitals Group     BP 03/16/24 1015 109/73     Systolic BP Percentile --      Diastolic BP Percentile --       Pulse Rate 03/16/24 1015 81     Resp 03/16/24 1015 20     Temp 03/16/24 1015 97.8 F (36.6 C)     Temp Source 03/16/24 1015 Oral     SpO2 03/16/24 1015 97 %     Weight 03/16/24 1012 188 lb 0.8 oz (85.3 kg)     Height  03/16/24 1012 5' 3.5" (1.613 m)     Head Circumference --      Peak Flow --      Pain Score 03/16/24 1010 0     Pain Loc --      Pain Education --      Exclude from Growth Chart --    No data found.  Updated Vital Signs BP 109/73 (BP Location: Left Arm)   Pulse 81   Temp 97.8 F (36.6 C) (Oral)   Resp 20   Ht 5' 3.5" (1.613 m)   Wt 188 lb 0.8 oz (85.3 kg)   LMP 09/25/2018   SpO2 97%   BMI 32.79 kg/m   Visual Acuity Right Eye Distance:   Left Eye Distance:   Bilateral Distance:    Right Eye Near:   Left Eye Near:    Bilateral Near:     Physical Exam Vitals reviewed.  Constitutional:      Appearance: Normal appearance.  HENT:     Head: Normocephalic and atraumatic.     Nose: Congestion and rhinorrhea present.  Eyes:     Extraocular Movements: Extraocular movements intact.     Pupils: Pupils are equal, round, and reactive to light.  Cardiovascular:     Rate and Rhythm: Normal rate and regular rhythm.  Pulmonary:     Breath sounds: Rhonchi present.  Abdominal:     General: Abdomen is flat.     Palpations: Abdomen is soft.  Musculoskeletal:     Cervical back: Normal range of motion.  Skin:    General: Skin is warm and dry.     Capillary Refill: Capillary refill takes less than 2 seconds.  Neurological:     General: No focal deficit present.     Mental Status: She is alert and oriented to person, place, and time.     UC Treatments / Results  Labs (all labs ordered are listed, but only abnormal results are displayed) Labs Reviewed  POC SARS CORONAVIRUS 2 AG -  ED - Abnormal; Notable for the following components:      Result Value   SARS Coronavirus 2 Ag Positive (*)    All other components within normal limits     EKG   Radiology No results found.  Procedures Procedures (including critical care time)  Medications Ordered in UC Medications  albuterol (VENTOLIN HFA) 108 (90 Base) MCG/ACT inhaler 2 puff (2 puffs Inhalation Given 03/16/24 1056)    Initial Impression / Assessment and Plan / UC Course  I have reviewed the triage vital signs and the nursing notes.  Pertinent labs & imaging results that were available during my care of the patient were reviewed by me and considered in my medical decision making (see chart for details).    COVID-19 Virus  Acute Bronchitis  Point-of-care COVID test is positive however patient has been sick for 4 days and has remained afebrile.  She is generally well-appearing and according to [facility] guidelines as discussed with patient the longer any extended period of quarantine in the absence of fever.  Patient advised she can attend conference would recommend wearing a mask to prevent contracting a secondary illness or preventing the spread of any lingering COVID viral symptoms.  Return precautions given if symptoms worsen or do not improve. Final Clinical Impressions(s) / UC Diagnoses   Final diagnoses:  COVID-19 virus infection  Acute bronchitis, unspecified organism     Discharge Instructions      As long as you feel okay  I am fine with you attending the conference given the worse of your symptoms have subsided and you have remained afebrile.  Suggest wearing a mask to prevent infecting others or possibly acquiring a secondary respiratory illness. Recommend taking an antihistamine such as cetirizine or Xyzal to help prevent any coughing or congestion symptoms associated with outdoor allergens as the pollen count is expected increase over the weekend.     ED Prescriptions     Medication Sig Dispense Auth. Provider   predniSONE  (DELTASONE ) 20 MG tablet Take 2 tablets (40 mg total) by mouth daily with breakfast for 5 days. 10 tablet Buena Carmine, NP    promethazine -dextromethorphan (PROMETHAZINE -DM) 6.25-15 MG/5ML syrup Take 5 mLs by mouth 4 (four) times daily as needed for cough (congestion and upper respiratory). 180 mL Buena Carmine, NP      PDMP not reviewed this encounter.   Buena Carmine, NP 03/16/24 1101

## 2024-03-16 NOTE — ED Triage Notes (Signed)
"  This started Tuesday mid day with feeling yuck, nasal drainage, by that night fatigue and just wiped out". 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 No fever. Some runny nose and cough "now, with body aches".

## 2024-03-23 ENCOUNTER — Other Ambulatory Visit

## 2024-03-26 ENCOUNTER — Other Ambulatory Visit: Payer: Self-pay

## 2024-03-26 ENCOUNTER — Encounter
Admission: RE | Admit: 2024-03-26 | Discharge: 2024-03-26 | Disposition: A | Discharge status: Home / Self Care | Source: Ambulatory Visit | Attending: Obstetrics and Gynecology | Admitting: Obstetrics and Gynecology

## 2024-03-26 VITALS — Ht 63.5 in | Wt 185.0 lb

## 2024-03-26 DIAGNOSIS — I10 Essential (primary) hypertension: Secondary | ICD-10-CM

## 2024-03-26 HISTORY — DX: Sleep apnea, unspecified: G47.30

## 2024-03-26 NOTE — Patient Instructions (Addendum)
 Your procedure is scheduled on: Tuesday 04/03/24  Report to the Registration Desk on the 1st floor of the [facility]. To find out your arrival time, please call [phone] between 1PM - 3PM on: Monday 04/02/24  If your arrival time is 6:00 am, do not arrive before that time as the [facility] entrance doors do not open until 6:00 am.  REMEMBER: Instructions that are not followed completely may result in serious medical risk, up to and including death; or upon the discretion of your surgeon and anesthesiologist your surgery may need to be rescheduled.  Do not eat food after midnight the night before surgery.  No gum chewing or hard candies.  You may however, drink CLEAR liquids up to 2 hours before you are scheduled to arrive for your surgery. Do not drink anything within 2 hours of your scheduled arrival time.  Clear liquids include: - water  - apple juice without pulp - gatorade (not RED colors) - black coffee or tea (Do NOT add milk or creamers to the coffee or tea) Do NOT drink anything that is not on this list.  In addition, your doctor has ordered for you to drink the provided:  Ensure Pre-Surgery Clear Carbohydrate Drink  Drinking this carbohydrate drink up to two hours before surgery helps to reduce insulin resistance and improve patient outcomes. Please complete drinking 2 hours before scheduled arrival time.  One week prior to surgery: Stop Anti-inflammatories (NSAIDS) such as Advil , Aleve , Ibuprofen , Motrin , Naproxen , Naprosyn  and Aspirin  based products such as Excedrin, Goody's Powder, BC Powder. Stop ANY OVER THE COUNTER supplements until after surgery. b complex vitamin  Multiple Vitamin (MULTIVITAMIN WITH MINERALS)  Vitamin D, Ergocalciferol, (DRISDOL) 1.25 MG   You may however, continue to take Tylenol  if needed for pain up until the day of surgery.   Continue taking all of your other prescription medications up until the day of surgery.  ON THE DAY OF SURGERY  ONLY TAKE THESE MEDICATIONS WITH SIPS OF WATER:  buPROPion  (WELLBUTRIN  XL) 150 MG    No Alcohol for 24 hours before or after surgery.  No Smoking including e-cigarettes for 24 hours before surgery.  No chewable tobacco products for at least 6 hours before surgery.  No nicotine patches on the day of surgery.  Do not use any "recreational" drugs for at least a week (preferably 2 weeks) before your surgery.  Please be advised that the combination of cocaine and anesthesia may have negative outcomes, up to and including death. If you test positive for cocaine, your surgery will be cancelled.  On the morning of surgery brush your teeth with toothpaste and water, you may rinse your mouth with mouthwash if you wish. Do not swallow any toothpaste or mouthwash.  Use CHG Soap or wipes as directed on instruction sheet.  Do not wear jewelry, make-up, hairpins, clips or nail polish.  For welded (permanent) jewelry: bracelets, anklets, waist bands, etc.  Please have this removed prior to surgery.  If it is not removed, there is a chance that hospital personnel will need to cut it off on the day of surgery.  Do not wear lotions, powders, or perfumes.   Do not shave body hair from the neck down 48 hours before surgery.  Contact lenses, hearing aids and dentures may not be worn into surgery.  Do not bring valuables to the hospital. [facility] is not responsible for any missing/lost belongings or valuables.   Notify your doctor if there is any change  in your medical condition (cold, fever, infection).  Wear comfortable clothing (specific to your surgery type) to the hospital.  After surgery, you can help prevent lung complications by doing breathing exercises.  Take deep breaths and cough every 1-2 hours. Your doctor may order a device called an Incentive Spirometer to help you take deep breaths. When coughing or sneezing, hold a pillow firmly against your incision with both hands. This is  called "splinting." Doing this helps protect your incision. It also decreases belly discomfort.  If you are being admitted to the hospital overnight, leave your suitcase in the car. After surgery it may be brought to your room.  In case of increased patient census, it may be necessary for you, the patient, to continue your postoperative care in the Same Day Surgery department.  If you are being discharged the day of surgery, you will not be allowed to drive home. You will need a responsible individual to drive you home and stay with you for 24 hours after surgery.   If you are taking public transportation, you will need to have a responsible individual with you.  Please call the Pre-admissions Testing Dept. at [phone] if you have any questions about these instructions.  Surgery Visitation Policy:  Patients having surgery or a procedure may have two visitors.  Children under the age of 22 must have an adult with them who is not the patient.  Inpatient Visitation:    Visiting hours are 7 a.m. to 8 p.m. Up to four visitors are allowed at one time in a patient room. The visitors may rotate out with other people during the day.  One visitor age 59 or older may stay with the patient overnight and must be in the room by 8 p.m.     Preparing for Surgery with CHLORHEXIDINE  GLUCONATE (CHG) Soap  Chlorhexidine  Gluconate (CHG) Soap  o An antiseptic cleaner that kills germs and bonds with the skin to continue killing germs even after washing  o Used for showering the night before surgery and morning of surgery  Before surgery, you can play an important role by reducing the number of germs on your skin.  CHG (Chlorhexidine  gluconate) soap is an antiseptic cleanser which kills germs and bonds with the skin to continue killing germs even after washing.  Please do not use if you have an allergy to CHG or antibacterial soaps. If your skin becomes reddened/irritated stop using the  CHG.  1. Shower the NIGHT BEFORE SURGERY and the MORNING OF SURGERY with CHG soap.  2. If you choose to wash your hair, wash your hair first as usual with your normal shampoo.  3. After shampooing, rinse your hair and body thoroughly to remove the shampoo.  4. Use CHG as you would any other liquid soap. You can apply CHG directly to the skin and wash gently with a scrungie or a clean washcloth.  5. Apply the CHG soap to your body only from the neck down. Do not use on open wounds or open sores. Avoid contact with your eyes, ears, mouth, and genitals (private parts). Wash face and genitals (private parts) with your normal soap.  6. Wash thoroughly, paying special attention to the area where your surgery will be performed.  7. Thoroughly rinse your body with warm water.  8. Do not shower/wash with your normal soap after using and rinsing off the CHG soap.  9. Pat yourself dry with a clean towel.  10. Wear clean pajamas to bed  the night before surgery.  12. Place clean sheets on your bed the night of your first shower and do not sleep with pets.  13. Shower again with the CHG soap on the day of surgery prior to arriving at the hospital.  14. Do not apply any deodorants/lotions/powders.  15. Please wear clean clothes to the hospital.

## 2024-03-30 ENCOUNTER — Encounter
Admission: RE | Admit: 2024-03-30 | Discharge: 2024-03-30 | Disposition: A | Discharge status: Home / Self Care | Source: Ambulatory Visit | Attending: Obstetrics and Gynecology | Admitting: Obstetrics and Gynecology

## 2024-03-30 DIAGNOSIS — I10 Essential (primary) hypertension: Secondary | ICD-10-CM | POA: Diagnosis not present

## 2024-03-30 DIAGNOSIS — Z01818 Encounter for other preprocedural examination: Secondary | ICD-10-CM | POA: Insufficient documentation

## 2024-03-30 LAB — CBC
HCT: 40.3 % (ref 36.0–46.0)
Hemoglobin: 13.8 g/dL (ref 12.0–15.0)
MCH: 30.9 pg (ref 26.0–34.0)
MCHC: 34.2 g/dL (ref 30.0–36.0)
MCV: 90.2 fL (ref 80.0–100.0)
Platelets: 325 10*3/uL (ref 150–400)
RBC: 4.47 MIL/uL (ref 3.87–5.11)
RDW: 12.9 % (ref 11.5–15.5)
WBC: 7.7 10*3/uL (ref 4.0–10.5)
nRBC: 0 % (ref 0.0–0.2)

## 2024-03-30 LAB — BASIC METABOLIC PANEL WITH GFR
Anion gap: 7 (ref 5–15)
BUN: 16 mg/dL (ref 6–20)
CO2: 27 mmol/L (ref 22–32)
Calcium: 9.7 mg/dL (ref 8.9–10.3)
Chloride: 105 mmol/L (ref 98–111)
Creatinine, Ser: 0.76 mg/dL (ref 0.44–1.00)
GFR, Estimated: >60 mL/min (ref >60)
Glucose, Bld: 97 mg/dL (ref 70–99)
Potassium: 3.5 mmol/L (ref 3.5–5.1)
Sodium: 139 mmol/L (ref 135–145)

## 2024-03-30 LAB — TYPE AND SCREEN
ABO/RH(D): B POS
Antibody Screen: NEGATIVE

## 2024-04-03 ENCOUNTER — Ambulatory Visit
Admission: RE | Admit: 2024-04-03 | Discharge: 2024-04-03 | Disposition: A | Discharge status: Home / Self Care | Attending: Obstetrics and Gynecology | Admitting: Obstetrics and Gynecology

## 2024-04-03 ENCOUNTER — Encounter: Payer: Self-pay | Admitting: Obstetrics and Gynecology

## 2024-04-03 ENCOUNTER — Other Ambulatory Visit: Payer: Self-pay

## 2024-04-03 ENCOUNTER — Ambulatory Visit: Admitting: Urgent Care

## 2024-04-03 ENCOUNTER — Encounter
Admission: RE | Disposition: A | Discharge status: Home / Self Care | Payer: Self-pay | Source: Home / Self Care | Attending: Obstetrics and Gynecology

## 2024-04-03 DIAGNOSIS — Z01818 Encounter for other preprocedural examination: Secondary | ICD-10-CM

## 2024-04-03 DIAGNOSIS — R9389 Abnormal findings on diagnostic imaging of other specified body structures: Secondary | ICD-10-CM | POA: Diagnosis not present

## 2024-04-03 DIAGNOSIS — E669 Obesity, unspecified: Secondary | ICD-10-CM | POA: Diagnosis not present

## 2024-04-03 DIAGNOSIS — Z791 Long term (current) use of non-steroidal anti-inflammatories (NSAID): Secondary | ICD-10-CM | POA: Diagnosis not present

## 2024-04-03 DIAGNOSIS — K219 Gastro-esophageal reflux disease without esophagitis: Secondary | ICD-10-CM | POA: Insufficient documentation

## 2024-04-03 DIAGNOSIS — Z79899 Other long term (current) drug therapy: Secondary | ICD-10-CM | POA: Insufficient documentation

## 2024-04-03 DIAGNOSIS — I10 Essential (primary) hypertension: Secondary | ICD-10-CM | POA: Diagnosis not present

## 2024-04-03 DIAGNOSIS — R519 Headache, unspecified: Secondary | ICD-10-CM | POA: Diagnosis not present

## 2024-04-03 DIAGNOSIS — Z6832 Body mass index (BMI) 32.0-32.9, adult: Secondary | ICD-10-CM | POA: Diagnosis not present

## 2024-04-03 DIAGNOSIS — N950 Postmenopausal bleeding: Secondary | ICD-10-CM | POA: Insufficient documentation

## 2024-04-03 DIAGNOSIS — N840 Polyp of corpus uteri: Secondary | ICD-10-CM | POA: Insufficient documentation

## 2024-04-03 DIAGNOSIS — Z79890 Hormone replacement therapy (postmenopausal): Secondary | ICD-10-CM | POA: Insufficient documentation

## 2024-04-03 HISTORY — PX: HYSTEROSCOPY WITH D & C: SHX1775

## 2024-04-03 SURGERY — DILATATION AND CURETTAGE /HYSTEROSCOPY
Anesthesia: General | Site: Cervix

## 2024-04-03 MED ORDER — LIDOCAINE HCL (CARDIAC) PF 100 MG/5ML IV SOSY
PREFILLED_SYRINGE | INTRAVENOUS | Status: DC | PRN
  Administered 2024-04-03: 80 mg via INTRAVENOUS

## 2024-04-03 MED ORDER — PROPOFOL 10 MG/ML IV BOLUS
INTRAVENOUS | Status: AC
  Filled 2024-04-03: qty 40

## 2024-04-03 MED ORDER — ONDANSETRON HCL 4 MG/2ML IJ SOLN
INTRAMUSCULAR | Status: AC
  Filled 2024-04-03: qty 2

## 2024-04-03 MED ORDER — LACTATED RINGERS IV SOLN: INTRAVENOUS | Status: DC

## 2024-04-03 MED ORDER — SCOPOLAMINE 1 MG/3DAYS TD PT72
MEDICATED_PATCH | TRANSDERMAL | Status: AC
  Filled 2024-04-03: qty 1

## 2024-04-03 MED ORDER — MIDAZOLAM HCL 2 MG/2ML IJ SOLN
INTRAMUSCULAR | Status: AC
  Filled 2024-04-03: qty 2

## 2024-04-03 MED ORDER — KETOROLAC TROMETHAMINE 30 MG/ML IJ SOLN
INTRAMUSCULAR | Status: DC | PRN
  Administered 2024-04-03: 30 mg via INTRAVENOUS

## 2024-04-03 MED ORDER — CHLORHEXIDINE GLUCONATE 0.12 % MT SOLN
OROMUCOSAL | Status: AC
  Filled 2024-04-03: qty 15

## 2024-04-03 MED ORDER — SODIUM CHLORIDE 0.9% FLUSH: 3.0000 mL | INTRAVENOUS | Status: DC | PRN

## 2024-04-03 MED ORDER — POVIDONE-IODINE 10 % EX SWAB
2.0000 | Freq: Once | CUTANEOUS | Status: AC
  Administered 2024-04-03: 2 via TOPICAL

## 2024-04-03 MED ORDER — ACETAMINOPHEN 10 MG/ML IV SOLN: 1000.0000 mg | Freq: Once | INTRAVENOUS | Status: DC | PRN

## 2024-04-03 MED ORDER — OXYCODONE HCL 5 MG PO TABS
5.0000 mg | ORAL_TABLET | Freq: Once | ORAL | Status: AC | PRN
  Administered 2024-04-03: 5 mg via ORAL

## 2024-04-03 MED ORDER — CEFAZOLIN SODIUM-DEXTROSE 2-4 GM/100ML-% IV SOLN
INTRAVENOUS | Status: AC
  Filled 2024-04-03: qty 100

## 2024-04-03 MED ORDER — SODIUM CHLORIDE 0.9% FLUSH: 3.0000 mL | Freq: Two times a day (BID) | INTRAVENOUS | Status: DC

## 2024-04-03 MED ORDER — DEXAMETHASONE SODIUM PHOSPHATE 10 MG/ML IJ SOLN
INTRAMUSCULAR | Status: DC | PRN
  Administered 2024-04-03: 10 mg via INTRAVENOUS

## 2024-04-03 MED ORDER — FENTANYL CITRATE (PF) 100 MCG/2ML IJ SOLN
INTRAMUSCULAR | Status: AC
  Filled 2024-04-03: qty 2

## 2024-04-03 MED ORDER — OXYCODONE HCL 5 MG/5ML PO SOLN: 5.0000 mg | Freq: Once | ORAL | Status: AC | PRN

## 2024-04-03 MED ORDER — ORAL CARE MOUTH RINSE: 15.0000 mL | Freq: Once | OROMUCOSAL | Status: AC

## 2024-04-03 MED ORDER — ONDANSETRON HCL 4 MG/2ML IJ SOLN
INTRAMUSCULAR | Status: DC | PRN
  Administered 2024-04-03: 4 mg via INTRAVENOUS

## 2024-04-03 MED ORDER — FENTANYL CITRATE (PF) 100 MCG/2ML IJ SOLN
INTRAMUSCULAR | Status: DC | PRN
  Administered 2024-04-03 (×2): 50 ug via INTRAVENOUS

## 2024-04-03 MED ORDER — ACETAMINOPHEN 10 MG/ML IV SOLN
INTRAVENOUS | Status: AC
  Filled 2024-04-03: qty 100

## 2024-04-03 MED ORDER — CHLORHEXIDINE GLUCONATE 0.12 % MT SOLN
15.0000 mL | Freq: Once | OROMUCOSAL | Status: AC
  Administered 2024-04-03: 15 mL via OROMUCOSAL

## 2024-04-03 MED ORDER — KETOROLAC TROMETHAMINE 30 MG/ML IJ SOLN
INTRAMUSCULAR | Status: AC
  Filled 2024-04-03: qty 1

## 2024-04-03 MED ORDER — LIDOCAINE HCL (PF) 2 % IJ SOLN
INTRAMUSCULAR | Status: AC
  Filled 2024-04-03: qty 5

## 2024-04-03 MED ORDER — FENTANYL CITRATE (PF) 100 MCG/2ML IJ SOLN
25.0000 ug | INTRAMUSCULAR | Status: DC | PRN
  Administered 2024-04-03 (×2): 25 ug via INTRAVENOUS
  Administered 2024-04-03: 50 ug via INTRAVENOUS

## 2024-04-03 MED ORDER — PROPOFOL 10 MG/ML IV BOLUS
INTRAVENOUS | Status: DC | PRN
  Administered 2024-04-03: 25 ug/kg/min via INTRAVENOUS
  Administered 2024-04-03: 50 mg via INTRAVENOUS
  Administered 2024-04-03: 150 mg via INTRAVENOUS

## 2024-04-03 MED ORDER — MIDAZOLAM HCL 2 MG/2ML IJ SOLN
INTRAMUSCULAR | Status: DC | PRN
  Administered 2024-04-03: 2 mg via INTRAVENOUS

## 2024-04-03 MED ORDER — ACETAMINOPHEN 10 MG/ML IV SOLN
INTRAVENOUS | Status: DC | PRN
  Administered 2024-04-03: 1000 mg via INTRAVENOUS

## 2024-04-03 MED ORDER — SCOPOLAMINE 1 MG/3DAYS TD PT72
1.0000 | MEDICATED_PATCH | TRANSDERMAL | Status: DC
  Administered 2024-04-03: 1.5 mg via TRANSDERMAL

## 2024-04-03 MED ORDER — ONDANSETRON HCL 4 MG/2ML IJ SOLN: 4.0000 mg | Freq: Once | INTRAMUSCULAR | Status: DC | PRN

## 2024-04-03 MED ORDER — CEFAZOLIN SODIUM-DEXTROSE 2-4 GM/100ML-% IV SOLN
2.0000 g | Freq: Once | INTRAVENOUS | Status: AC
  Administered 2024-04-03: 2 g via INTRAVENOUS

## 2024-04-03 MED ORDER — OXYCODONE HCL 5 MG PO TABS
ORAL_TABLET | ORAL | Status: AC
  Filled 2024-04-03: qty 1

## 2024-04-03 MED ORDER — DEXAMETHASONE SODIUM PHOSPHATE 10 MG/ML IJ SOLN
INTRAMUSCULAR | Status: AC
  Filled 2024-04-03: qty 1

## 2024-04-03 SURGICAL SUPPLY — 21 items
BAG PRESSURE INF REUSE 1000 (BAG) ×1 IMPLANT
DEVICE MYOSURE LITE (MISCELLANEOUS) IMPLANT
DEVICE MYOSURE REACH (MISCELLANEOUS) IMPLANT
DRSG TELFA 3X8 NADH STRL (GAUZE/BANDAGES/DRESSINGS) ×1 IMPLANT
GLOVE SURG SYN 8.0 (GLOVE) ×1 IMPLANT
GLOVE SURG SYN 8.0 PF PI (GLOVE) ×1 IMPLANT
GOWN STRL REUS W/ TWL LRG LVL3 (GOWN DISPOSABLE) ×1 IMPLANT
GOWN STRL REUS W/ TWL XL LVL3 (GOWN DISPOSABLE) ×1 IMPLANT
IV NS 1000ML BAXH (IV SOLUTION) ×1 IMPLANT
KIT PROCEDURE FLUENT (KITS) IMPLANT
KIT TURNOVER CYSTO (KITS) ×1 IMPLANT
MANIFOLD NEPTUNE II (INSTRUMENTS) ×1 IMPLANT
PACK DNC HYST (MISCELLANEOUS) IMPLANT
SEAL ROD LENS SCOPE MYOSURE (ABLATOR) ×1 IMPLANT
SET CYSTO W/LG BORE CLAMP LF (SET/KITS/TRAYS/PACK) IMPLANT
SOL .9 NS 3000ML IRR UROMATIC (IV SOLUTION) ×1 IMPLANT
SOLUTION PREP PVP 2OZ (MISCELLANEOUS) ×1 IMPLANT
TOWEL OR 17X26 4PK STRL BLUE (TOWEL DISPOSABLE) ×1 IMPLANT
TRAP FLUID SMOKE EVACUATOR (MISCELLANEOUS) ×1 IMPLANT
TUBING CONNECTING 10 (TUBING) IMPLANT
WATER STERILE IRR 500ML POUR (IV SOLUTION) ×1 IMPLANT

## 2024-04-03 NOTE — Brief Op Note (Signed)
 04/03/2024  11:56 AM  PATIENT:  Tamara Frazier  53 y.o. female  PRE-OPERATIVE DIAGNOSIS:  postmenopausal bleeding  POST-OPERATIVE DIAGNOSIS:  postmenopausal bleeding Endometrial polyp PROCEDURE:  D+C , Hysteroscopy with myosure resection of endometrial polyp   SURGEON:  Surgeons and Role:    * Enes Wegener, Joselyn Nicely, MD - Primary  PHYSICIAN ASSISTANT:   ASSISTANTS: cst   ANESTHESIA:   general  EBL:  10 mL IOF 500 cc uo 90 cc  BLOOD ADMINISTERED:none  DRAINS: Urinary Catheter (Foley)   LOCAL MEDICATIONS USED:  OTHER toradol   SPECIMEN:  Source of Specimen:  ecc, endometrial curettings with endometrial polyp   DISPOSITION OF SPECIMEN:  PATHOLOGY  COUNTS:  YES  TOURNIQUET:  * No tourniquets in log *  DICTATION: .Other Dictation: Dictation Number verbal  PLAN OF CARE: Discharge to home after PACU  PATIENT DISPOSITION:  PACU - hemodynamically stable.   Delay start of Pharmacological VTE agent (>24hrs) due to surgical blood loss or risk of bleeding: not applicable

## 2024-04-03 NOTE — Transfer of Care (Signed)
 Immediate Anesthesia Transfer of Care Note  Patient: Tamara Frazier  Procedure(s) Performed: DILATATION AND CURETTAGE /HYSTEROSCOPY (Cervix)  Patient Location: PACU  Anesthesia Type:General  Level of Consciousness: awake  Airway & Oxygen Therapy: Patient Spontanous Breathing and Patient connected to face mask oxygen  Post-op Assessment: Report given to RN and Post -op Vital signs reviewed and stable  Post vital signs: Reviewed and stable  Last Vitals:  Vitals Value Taken Time  BP 129/77 04/03/24 1205  Temp    Pulse 82 04/03/24 1208  Resp    SpO2 100 % 04/03/24 1208  Vitals shown include unfiled device data.  Last Pain:  Vitals:   04/03/24 1034  TempSrc: Temporal  PainSc: 0-No pain         Complications: No notable events documented.

## 2024-04-03 NOTE — Progress Notes (Signed)
 Pt here for Fx D+C and h/s , possible myosure Labs reviewed . All questions answered  Proceed

## 2024-04-03 NOTE — Anesthesia Preprocedure Evaluation (Addendum)
 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) Abnormal ECG When compared with ECG of 06-Nov-2014 05:25, No significant change was found   Neuro/Psych  Headaches    GI/Hepatic ,GERD  ,,  Endo/Other  Obesity   Renal/GU negative Renal ROS     Musculoskeletal   Abdominal   Peds  Hematology negative hematology ROS (+)   Anesthesia Other Findings   Reproductive/Obstetrics                             Anesthesia Physical Anesthesia Plan  ASA: 2  Anesthesia Plan: General   Post-op Pain Management:    Induction: Intravenous  PONV Risk Score and Plan: 4 or greater and Ondansetron , Dexamethasone  and Treatment may vary due to age or medical condition  Airway Management Planned: LMA  Additional Equipment:   Intra-op Plan:   Post-operative Plan: Extubation in OR  Informed Consent: I have reviewed the patients History and Physical, chart, labs and discussed the procedure including the risks, benefits and alternatives for the proposed anesthesia with the patient or authorized representative who has indicated his/her understanding and acceptance.     Dental advisory given  Plan Discussed with: CRNA  Anesthesia Plan Comments: (Patient consented for risks of anesthesia including but not limited to:  - adverse reactions to  medications - damage to eyes, teeth, lips or other oral mucosa - nerve damage due to positioning  - sore throat or hoarseness - damage to heart, brain, nerves, lungs, other parts of body or loss of life  Informed patient about role of CRNA in peri- and intra-operative care.  Patient voiced understanding.)        Anesthesia Quick Evaluation

## 2024-04-03 NOTE — Anesthesia Postprocedure Evaluation (Signed)
 Anesthesia Post Note  Patient: Tamara Frazier  Procedure(s) Performed: DILATATION AND CURETTAGE /HYSTEROSCOPY (Cervix)  Patient location during evaluation: PACU Anesthesia Type: General Level of consciousness: awake and alert, oriented and patient cooperative Pain management: pain level controlled Vital Signs Assessment: post-procedure vital signs reviewed and stable Respiratory status: spontaneous breathing, nonlabored ventilation and respiratory function stable Cardiovascular status: blood pressure returned to baseline and stable Postop Assessment: adequate PO intake Anesthetic complications: no   No notable events documented.   Last Vitals:  Vitals:   04/03/24 1245 04/03/24 1300  BP: 108/69   Pulse: 72   Resp: 12   Temp:  (!) 36.1 C  SpO2: 95%     Last Pain:  Vitals:   04/03/24 1300  TempSrc:   PainSc: 4                  Krista Godsil

## 2024-04-03 NOTE — Anesthesia Procedure Notes (Signed)
 Procedure Name: LMA Insertion Date/Time: 04/03/2024 11:28 AM  Performed by: Juanda Noon, CRNAPre-anesthesia Checklist: Patient identified, Emergency Drugs available, Suction available and Patient being monitored Patient Re-evaluated:Patient Re-evaluated prior to induction Oxygen Delivery Method: Circle system utilized Preoxygenation: Pre-oxygenation with 100% oxygen Induction Type: IV induction Ventilation: Mask ventilation without difficulty LMA: LMA inserted LMA Size: 3.0 Tube type: Oral Number of attempts: 1 Airway Equipment and Method: Oral airway Placement Confirmation: positive ETCO2 and breath sounds checked- equal and bilateral Tube secured with: Tape Dental Injury: Teeth and Oropharynx as per pre-operative assessment

## 2024-04-04 ENCOUNTER — Encounter: Payer: Self-pay | Admitting: Obstetrics and Gynecology

## 2024-04-04 LAB — SURGICAL PATHOLOGY

## 2024-04-04 NOTE — Op Note (Signed)
 NAME: MAURIELLE, PAROLA MEDICAL RECORD NO: [id number] ACCOUNT NO: [id number] DATE OF BIRTH: [birth year] FACILITY: [facility] LOCATION: [facility] PHYSICIAN: Carolynn Citrin, MD  Operative Report   DATE OF PROCEDURE: 04/03/2024  PREOPERATIVE DIAGNOSIS: Postmenopausal bleeding.  POSTOPERATIVE DIAGNOSIS: Postmenopausal bleeding.  PROCEDURE:  1. Dilation and curettage.  2. Hysteroscopic removal of endometrial polyp with MyoSure.  SURGEON: Carolynn Citrin, MD  ANESTHESIA: General endotracheal anesthesia.  INDICATIONS: A 53 year old gravida 5 para 5 patient with postmenopausal bleeding and ultrasound consistent with possible endometrial polyp.  DESCRIPTION OF PROCEDURE: After adequate general endotracheal anesthesia, the patient was placed in the dorsal supine position, legs in candy cane stirrups. The patient's perineum and vagina were prepped and draped in a sterile fashion. The patient did  receive 2 g of IV Ancef  prior to commencement of the case. Timeout was performed. Straight catheterization of the bladder yielded 90 mL clear urine. A weighted speculum was then placed in the posterior vaginal vault and the cervix was grasped with a  single-tooth tenaculum initially anteriorly and then posteriorly during the dilation portion. Cervix was dilated to #15 Hanks dilator without difficulty after obtaining an endocervical curettage. Uterus sounded to 9.5 cm. A 5-mm hysteroscope was advanced  into the endometrial cavity under direct visualization. Normal saline was used as distending medium and there was a 5 x 5 mm endometrial polyp noted in the right fundal area. MyoSure Lite was then brought up to the operative field and advanced into the  endometrial cavity and the polyp was then resected without difficulty. The MyoSure was then removed and the cervix was then dilated to #20 Hanks dilator followed by placement of a sharp curette and endometrial curettage was then performed. Good   hemostasis was noted. There were no complications.  INTRAOPERATIVE FLUIDS: 500 mL.  ESTIMATED BLOOD LOSS: 10 mL.  URINE OUTPUT: 90 mL.  DISPOSITION: The patient did receive 90 mg Toradol  at the end of the procedure and was taken to the recovery room in good condition.    SUJ D: 04/03/2024 12:09:57 pm T: 04/04/2024 2:44:00 am  JOB: [id number]/ [id number]

## 2024-06-26 ENCOUNTER — Emergency Department (HOSPITAL_BASED_OUTPATIENT_CLINIC_OR_DEPARTMENT_OTHER): Admitting: Radiology

## 2024-06-26 ENCOUNTER — Emergency Department (HOSPITAL_BASED_OUTPATIENT_CLINIC_OR_DEPARTMENT_OTHER)
Admission: EM | Admit: 2024-06-26 | Discharge: 2024-06-26 | Disposition: A | Discharge status: Home / Self Care | Attending: Emergency Medicine | Admitting: Emergency Medicine

## 2024-06-26 ENCOUNTER — Encounter (HOSPITAL_BASED_OUTPATIENT_CLINIC_OR_DEPARTMENT_OTHER): Payer: Self-pay | Admitting: Emergency Medicine

## 2024-06-26 ENCOUNTER — Other Ambulatory Visit: Payer: Self-pay

## 2024-06-26 ENCOUNTER — Emergency Department (HOSPITAL_BASED_OUTPATIENT_CLINIC_OR_DEPARTMENT_OTHER)

## 2024-06-26 DIAGNOSIS — M25552 Pain in left hip: Secondary | ICD-10-CM | POA: Insufficient documentation

## 2024-06-26 DIAGNOSIS — R0789 Other chest pain: Secondary | ICD-10-CM | POA: Insufficient documentation

## 2024-06-26 DIAGNOSIS — I10 Essential (primary) hypertension: Secondary | ICD-10-CM | POA: Insufficient documentation

## 2024-06-26 DIAGNOSIS — M25512 Pain in left shoulder: Secondary | ICD-10-CM | POA: Insufficient documentation

## 2024-06-26 DIAGNOSIS — Y92410 Unspecified street and highway as the place of occurrence of the external cause: Secondary | ICD-10-CM | POA: Diagnosis not present

## 2024-06-26 MED ORDER — LIDOCAINE 5 % EX PTCH
1.0000 | MEDICATED_PATCH | CUTANEOUS | Status: DC
  Administered 2024-06-26: 1 via TRANSDERMAL
  Filled 2024-06-26: qty 1

## 2024-06-26 MED ORDER — METHOCARBAMOL 500 MG PO TABS: 500.0000 mg | ORAL_TABLET | Freq: Two times a day (BID) | ORAL | 0 refills | Status: AC

## 2024-06-26 MED ORDER — HYDROCODONE-ACETAMINOPHEN 5-325 MG PO TABS
1.0000 | ORAL_TABLET | Freq: Once | ORAL | Status: AC
  Administered 2024-06-26: 1 via ORAL
  Filled 2024-06-26: qty 1

## 2024-06-26 MED ORDER — NAPROXEN 500 MG PO TABS: 500.0000 mg | ORAL_TABLET | Freq: Two times a day (BID) | ORAL | 0 refills | Status: AC
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
